# Patient Record
Sex: Female | Born: 1960 | Race: White | Hispanic: No | Smoking: Never smoker
Health system: Southern US, Community
[De-identification: ages and names within clinical notes are randomized; demographics above are authoritative.]

## PROBLEM LIST (undated history)

## (undated) DIAGNOSIS — I639 Cerebral infarction, unspecified: Secondary | ICD-10-CM

## (undated) DIAGNOSIS — K56609 Unspecified intestinal obstruction, unspecified as to partial versus complete obstruction: Secondary | ICD-10-CM

## (undated) DIAGNOSIS — F419 Anxiety disorder, unspecified: Secondary | ICD-10-CM

## (undated) DIAGNOSIS — F329 Major depressive disorder, single episode, unspecified: Secondary | ICD-10-CM

## (undated) DIAGNOSIS — K219 Gastro-esophageal reflux disease without esophagitis: Secondary | ICD-10-CM

## (undated) DIAGNOSIS — I82409 Acute embolism and thrombosis of unspecified deep veins of unspecified lower extremity: Secondary | ICD-10-CM

## (undated) DIAGNOSIS — F32A Depression, unspecified: Secondary | ICD-10-CM

## (undated) HISTORY — DX: Unspecified intestinal obstruction, unspecified as to partial versus complete obstruction: K56.609

## (undated) HISTORY — DX: Depression, unspecified: F32.A

## (undated) HISTORY — DX: Cerebral infarction, unspecified: I63.9

## (undated) HISTORY — DX: Acute embolism and thrombosis of unspecified deep veins of unspecified lower extremity: I82.409

## (undated) HISTORY — DX: Anxiety disorder, unspecified: F41.9

## (undated) HISTORY — DX: Gastro-esophageal reflux disease without esophagitis: K21.9

## (undated) HISTORY — DX: Major depressive disorder, single episode, unspecified: F32.9

---

## 2004-08-24 ENCOUNTER — Other Ambulatory Visit: Payer: Self-pay

## 2004-08-24 ENCOUNTER — Emergency Department: Payer: Self-pay | Admitting: Emergency Medicine

## 2005-09-14 ENCOUNTER — Other Ambulatory Visit: Payer: Self-pay

## 2005-09-14 ENCOUNTER — Emergency Department: Payer: Self-pay | Admitting: General Practice

## 2006-07-09 ENCOUNTER — Emergency Department: Payer: Self-pay | Admitting: Emergency Medicine

## 2006-07-22 ENCOUNTER — Emergency Department: Payer: Self-pay | Admitting: Internal Medicine

## 2006-07-22 ENCOUNTER — Other Ambulatory Visit: Payer: Self-pay

## 2007-12-27 ENCOUNTER — Emergency Department: Payer: Self-pay | Admitting: Emergency Medicine

## 2008-04-16 ENCOUNTER — Emergency Department: Payer: Self-pay | Admitting: Emergency Medicine

## 2008-12-28 ENCOUNTER — Emergency Department: Payer: Self-pay | Admitting: Emergency Medicine

## 2009-03-23 ENCOUNTER — Emergency Department: Payer: Self-pay | Admitting: Unknown Physician Specialty

## 2009-04-10 ENCOUNTER — Emergency Department: Payer: Self-pay | Admitting: Emergency Medicine

## 2010-04-01 ENCOUNTER — Inpatient Hospital Stay: Payer: Self-pay | Admitting: Surgery

## 2010-08-03 ENCOUNTER — Emergency Department: Payer: Self-pay | Admitting: Emergency Medicine

## 2011-01-30 IMAGING — CR DG CHEST 2V
1 series · 2 of 2 positions shown · non-contrast
Comparison: none

REASON FOR EXAM: palpatations
COMMENTS:   LMP: Now

PROCEDURE:     DXR - DXR CHEST PA (OR AP) AND LATERAL  - April 16, 2008  [DATE]
RESULT:     The lung fields are clear. The heart, mediastinal and osseous
structures show no acute changes. Degenerative spurring is noted at multiple
levels of the thoracic spine.

[Series 1: view not recorded · 0.17mm/px · 2 of 2 slices shown]
[im 1/2]
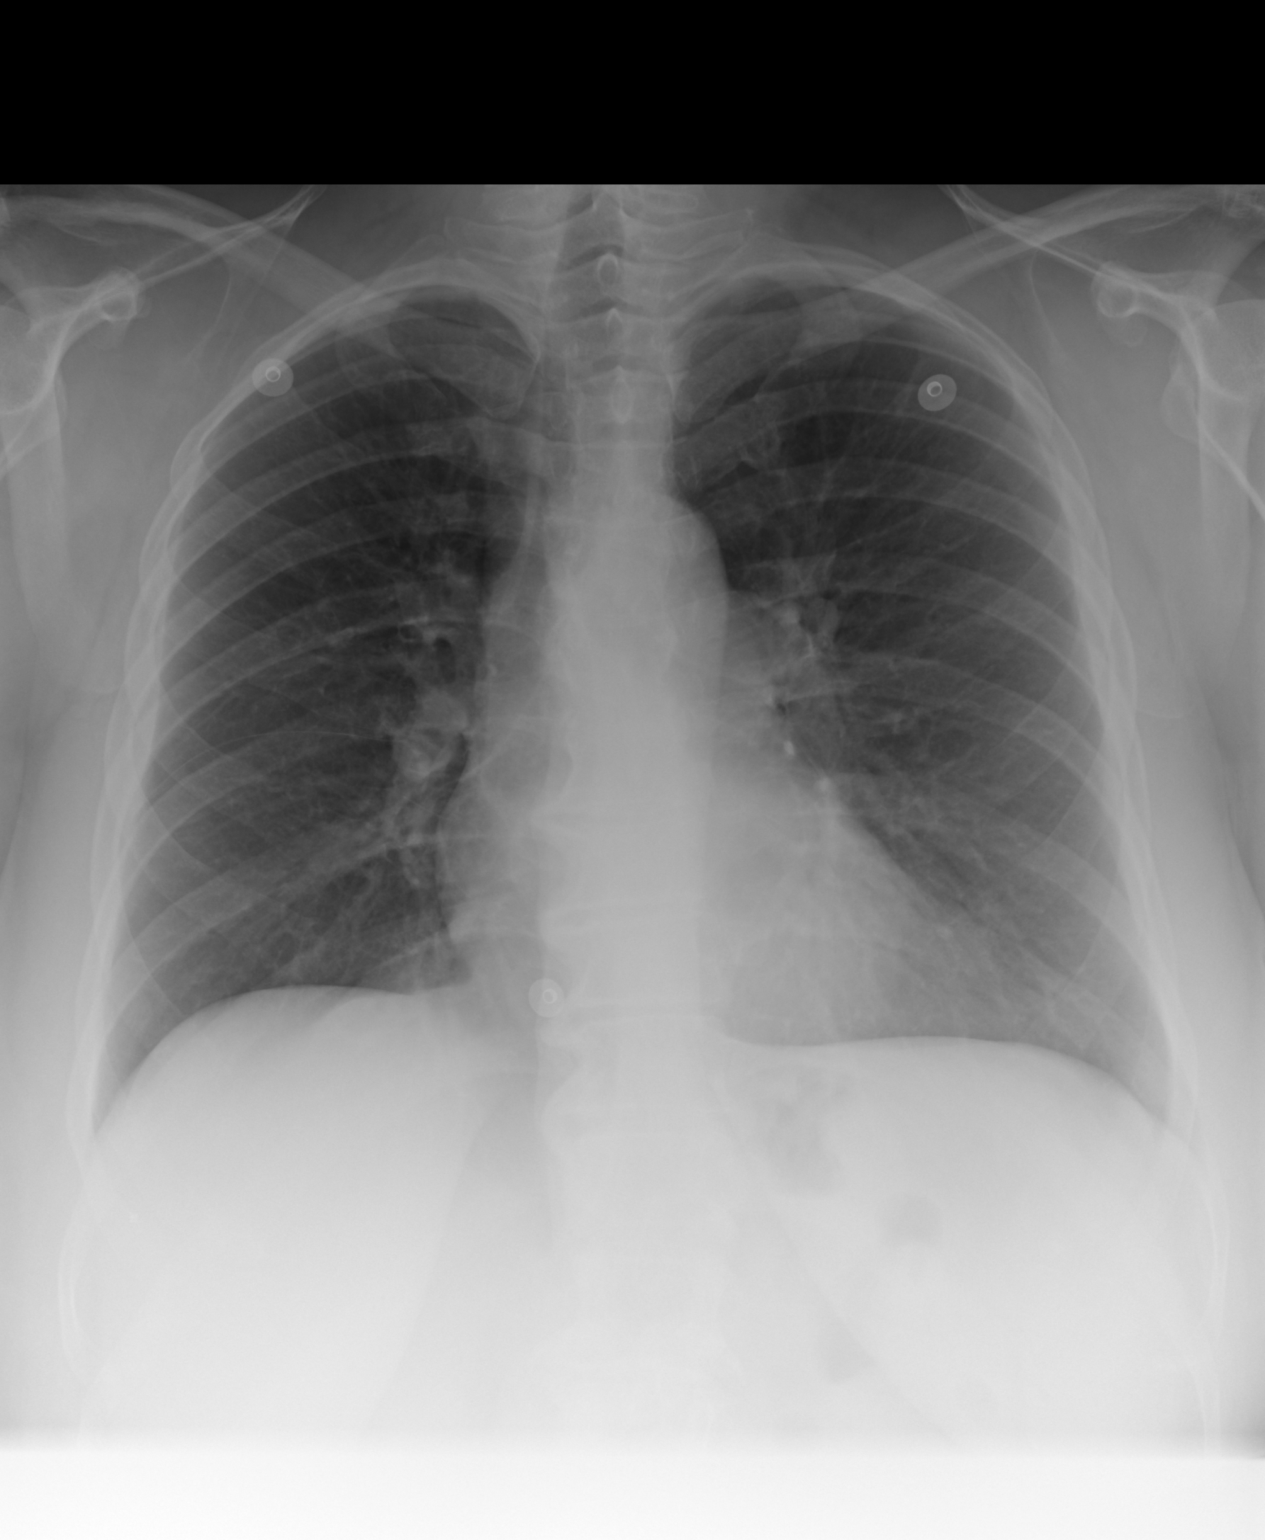
[im 2/2]
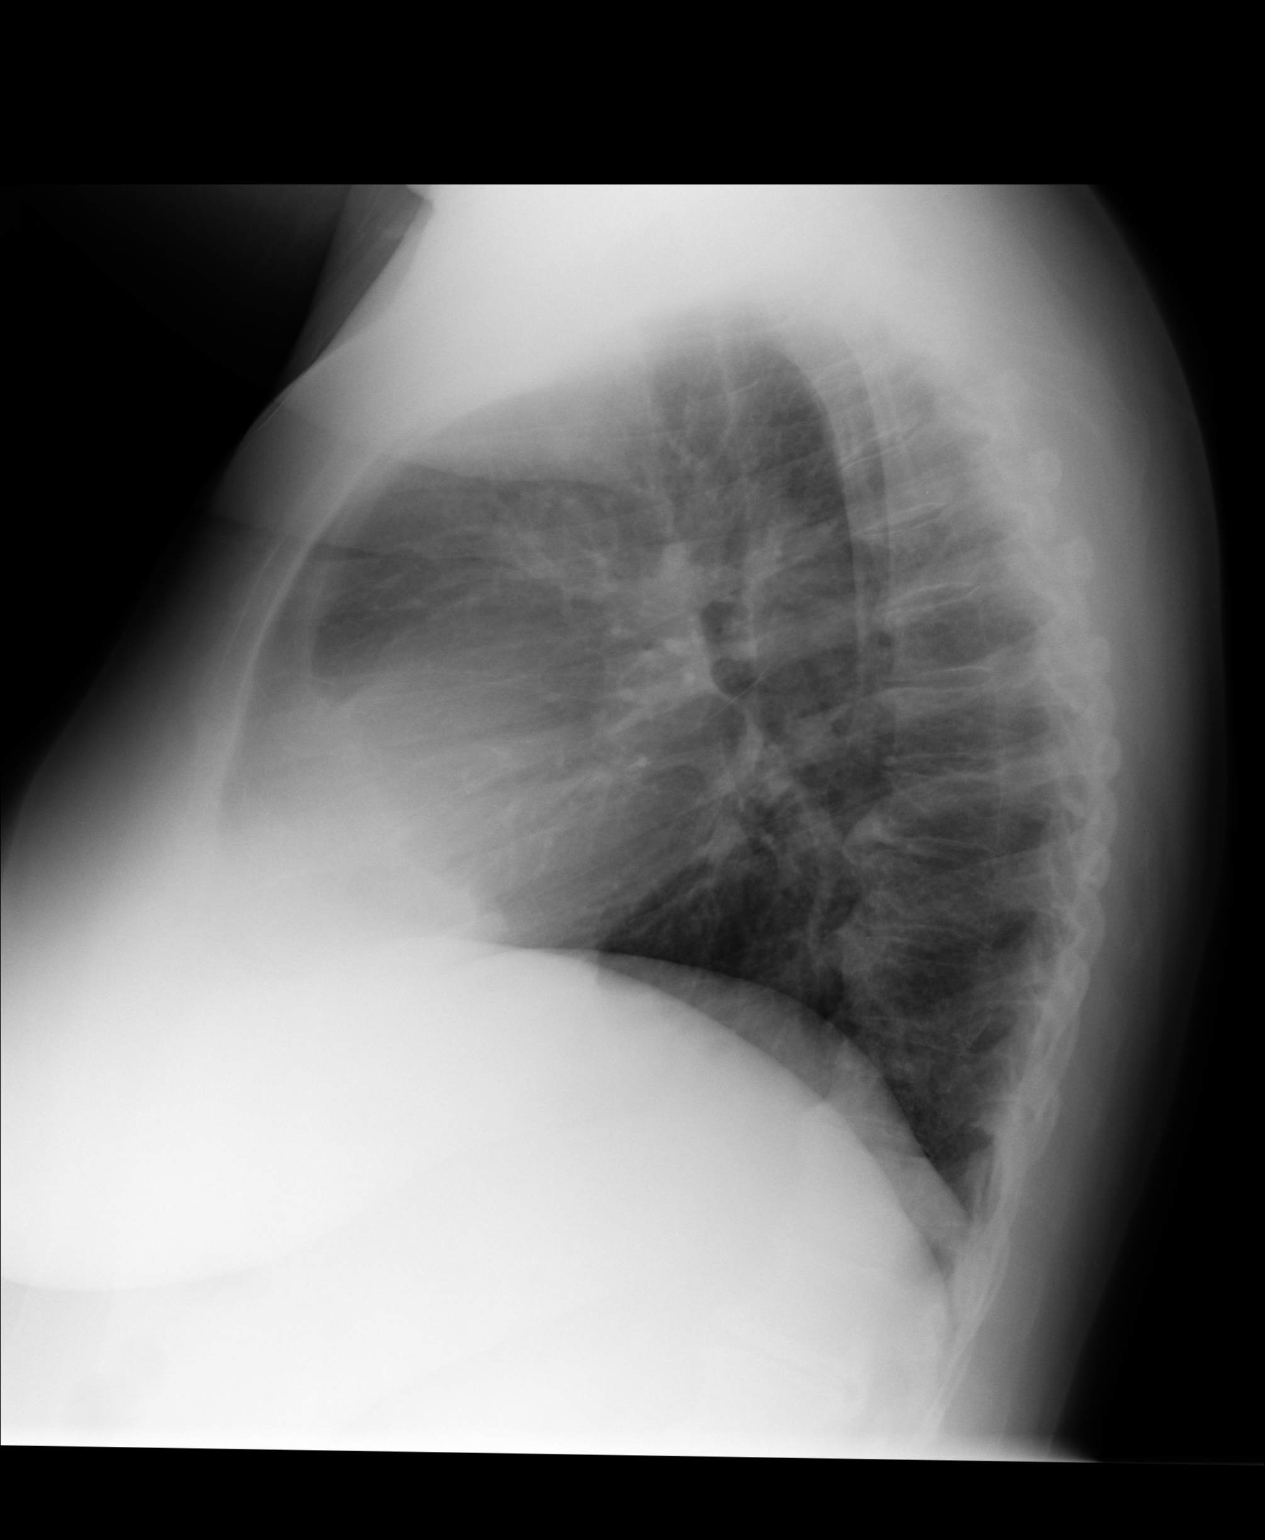

[2 of 2 positions shown; findings below may reference images not displayed]

IMPRESSION: No acute changes are identified.

## 2011-07-27 ENCOUNTER — Inpatient Hospital Stay: Payer: Self-pay | Admitting: Surgery

## 2011-07-27 LAB — URINALYSIS, COMPLETE
Nitrite: NEGATIVE
RBC,UR: 2 /HPF (ref 0–5)
Specific Gravity: 1.023 (ref 1.003–1.030)
Squamous Epithelial: 2
WBC UR: 3 /HPF (ref 0–5)

## 2011-07-27 LAB — COMPREHENSIVE METABOLIC PANEL
Albumin: 4 g/dL (ref 3.4–5.0)
Anion Gap: 6 — ABNORMAL LOW (ref 7–16)
Bilirubin,Total: 0.3 mg/dL (ref 0.2–1.0)
Chloride: 105 mmol/L (ref 98–107)
Co2: 28 mmol/L (ref 21–32)
EGFR (African American): >60
Glucose: 111 mg/dL — ABNORMAL HIGH (ref 65–99)
Potassium: 3.8 mmol/L (ref 3.5–5.1)
SGOT(AST): 33 U/L (ref 15–37)
Sodium: 139 mmol/L (ref 136–145)

## 2011-07-27 LAB — CBC
HCT: 27.3 % — ABNORMAL LOW (ref 35.0–47.0)
MCH: 16.1 pg — ABNORMAL LOW (ref 26.0–34.0)
Platelet: 757 10*3/uL — ABNORMAL HIGH (ref 150–440)
RBC: 5.04 10*6/uL (ref 3.80–5.20)
RDW: 22.3 % — ABNORMAL HIGH (ref 11.5–14.5)
WBC: 15.8 10*3/uL — ABNORMAL HIGH (ref 3.6–11.0)

## 2011-07-27 LAB — APTT: Activated PTT: 33.7 secs (ref 23.6–35.9)

## 2011-07-27 LAB — RETICULOCYTES
Absolute Retic Count: 0.0849 10*6/uL — ABNORMAL HIGH (ref 0.024–0.084)
Reticulocyte: 1.72 % — ABNORMAL HIGH (ref 0.5–1.5)

## 2011-07-27 LAB — LIPASE, BLOOD: Lipase: 119 U/L (ref 73–393)

## 2011-07-27 LAB — LACTATE DEHYDROGENASE: LDH: 197 U/L (ref 84–246)

## 2011-07-28 LAB — BASIC METABOLIC PANEL
BUN: 16 mg/dL (ref 7–18)
Calcium, Total: 8.9 mg/dL (ref 8.5–10.1)
Chloride: 106 mmol/L (ref 98–107)
Co2: 29 mmol/L (ref 21–32)
EGFR (African American): >60
EGFR (Non-African Amer.): >60
Glucose: 117 mg/dL — ABNORMAL HIGH (ref 65–99)
Osmolality: 282 (ref 275–301)
Potassium: 3.9 mmol/L (ref 3.5–5.1)

## 2011-07-28 LAB — CBC WITH DIFFERENTIAL/PLATELET
Basophil #: 0 10*3/uL (ref 0.0–0.1)
Basophil %: 0.4 %
Eosinophil %: 0.8 %
Lymphocyte #: 1.7 10*3/uL (ref 1.0–3.6)
Lymphocyte %: 16.9 %
MCH: 15.8 pg — ABNORMAL LOW (ref 26.0–34.0)
MCV: 54 fL — ABNORMAL LOW (ref 80–100)
Monocyte #: 1 x10 3/mm — ABNORMAL HIGH (ref 0.2–0.9)
RDW: 21.6 % — ABNORMAL HIGH (ref 11.5–14.5)

## 2011-07-28 LAB — IRON AND TIBC
Iron Bind.Cap.(Total): 523 ug/dL — ABNORMAL HIGH (ref 250–450)
Iron Saturation: 3 %

## 2013-02-11 HISTORY — PX: CARDIAC CATHETERIZATION: SHX172

## 2013-03-06 LAB — BASIC METABOLIC PANEL
BUN: 20 mg/dL — ABNORMAL HIGH (ref 7–18)
Calcium, Total: 9.7 mg/dL (ref 8.5–10.1)
Chloride: 107 mmol/L (ref 98–107)
Creatinine: 0.87 mg/dL (ref 0.60–1.30)
Sodium: 141 mmol/L (ref 136–145)

## 2013-03-06 LAB — CBC
HCT: 42.8 % (ref 35.0–47.0)
MCH: 28.7 pg (ref 26.0–34.0)
MCHC: 34 g/dL (ref 32.0–36.0)
MCV: 84 fL (ref 80–100)
Platelet: 407 10*3/uL (ref 150–440)
RBC: 5.07 10*6/uL (ref 3.80–5.20)
RDW: 14.5 % (ref 11.5–14.5)

## 2013-03-06 LAB — TROPONIN I: Troponin-I: 0.07 ng/mL — ABNORMAL HIGH

## 2013-03-07 ENCOUNTER — Inpatient Hospital Stay: Payer: Self-pay | Admitting: Internal Medicine

## 2013-03-07 DIAGNOSIS — I214 Non-ST elevation (NSTEMI) myocardial infarction: Secondary | ICD-10-CM

## 2013-03-07 DIAGNOSIS — Z86711 Personal history of pulmonary embolism: Secondary | ICD-10-CM

## 2013-03-07 DIAGNOSIS — Z8673 Personal history of transient ischemic attack (TIA), and cerebral infarction without residual deficits: Secondary | ICD-10-CM

## 2013-03-07 LAB — CK TOTAL AND CKMB (NOT AT ARMC)
CK, Total: 577 U/L — ABNORMAL HIGH (ref 21–215)
CK, Total: 503 U/L — ABNORMAL HIGH (ref 21–215)
CK-MB: 6.2 ng/mL — ABNORMAL HIGH (ref 0.5–3.6)

## 2013-03-07 LAB — PROTIME-INR
INR: 1.5
Prothrombin Time: 18.2 secs — ABNORMAL HIGH (ref 11.5–14.7)

## 2013-03-07 LAB — TROPONIN I
Troponin-I: 0.08 ng/mL — ABNORMAL HIGH
Troponin-I: 0.07 ng/mL — ABNORMAL HIGH

## 2013-03-08 ENCOUNTER — Encounter: Payer: Self-pay | Admitting: *Deleted

## 2013-03-08 DIAGNOSIS — I200 Unstable angina: Secondary | ICD-10-CM

## 2013-03-08 DIAGNOSIS — I517 Cardiomegaly: Secondary | ICD-10-CM

## 2013-03-08 LAB — BASIC METABOLIC PANEL
Calcium, Total: 9 mg/dL (ref 8.5–10.1)
Creatinine: 0.75 mg/dL (ref 0.60–1.30)
Glucose: 103 mg/dL — ABNORMAL HIGH (ref 65–99)
Osmolality: 276 (ref 275–301)
Potassium: 3.9 mmol/L (ref 3.5–5.1)

## 2013-03-08 LAB — LIPID PANEL
Cholesterol: 148 mg/dL (ref 0–200)
Triglycerides: 183 mg/dL (ref 0–200)
VLDL Cholesterol, Calc: 37 mg/dL (ref 5–40)

## 2013-03-11 ENCOUNTER — Telehealth: Payer: Self-pay

## 2013-03-11 NOTE — Telephone Encounter (Signed)
Attempted to contact pt regarding discharge from [facility] on 03/08/13. Patient has follow up with provider Dr. Kirke Corin on 03/28/13 at 2:15 at [facility].   Young man answered the phone stating the does not know anyone by the name of Shalie.

## 2013-03-20 ENCOUNTER — Encounter: Payer: Self-pay | Admitting: *Deleted

## 2013-03-28 ENCOUNTER — Encounter: Payer: Self-pay | Admitting: Cardiovascular Disease

## 2013-03-28 ENCOUNTER — Encounter: Payer: Self-pay | Admitting: *Deleted

## 2014-05-18 ENCOUNTER — Emergency Department: Payer: Self-pay | Admitting: Student

## 2014-07-04 NOTE — Discharge Summary (Signed)
PATIENT NAME:  Jillian Vazquez, Jillian Vazquez MR#:  [id number] DATE OF BIRTH:  1960/04/24  DATE OF ADMISSION:  03/07/2013  DATE OF DISCHARGE:  03/08/2013  DISCHARGE DIAGNOSIS: Non-ST elevation myocardial infarction, status post cardiac catheterization showing normal coronaries. Minimal troponin elevation could be secondary to coronary spasm or supply demand ischemia. Exact etiology is not clear at this time.   SECONDARY DIAGNOSES: 1.  Anxiety and depression.  2.  GERD.   3.  CVA. 4.  History of small bowel obstruction.  5.  History of DVT and PE, on chronic Coumadin.   CONSULTATION: Cardiology, Dr. Lorine BearsMuhammad Arida.   PROCEDURES/RADIOLOGY: Cardiac cath on December 26 by Dr. Kirke CorinArida showed normal coronary arteries, EF of 60%.   A 2-D echocardiogram on December 26 showed LVEF of 55% to 60%, impaired relaxation pattern of LV diastolic filling, mild concentric LVH, mildly dilated left atrium.   Chest x-ray on December 24 showed no acute cardiopulmonary disease.   HISTORY AND SHORT HOSPITAL COURSE: The patient is a 54 year old female with the above-mentioned medical problems, who was admitted for non-ST elevation MI with symptoms of chest, back and abdominal pain. Please see Dr. Camillia HerterMody's dictated History and Physical for further details. Cardiology consultation was obtained by Dr. Lorine BearsMuhammad Arida, who recommended a 2-D echocardiogram, which was obtained, with results dictated above. He also recommended cardiac cath, considering patient's elevated troponin, which was performed on December 26, showing normal coronary arteries. The patient remained chest pain-free and most of her symptoms were resolved, and was discharged home on December 26 in stable condition.   On the date of discharge, her vital signs were as follows: Temperature 98.3, heart rate 86 per minute, respirations 18 per minute, blood pressure 114/78 mmHg. She was  saturating 95% on room air.   PERTINENT PHYSICAL EXAMINATION ON THE DATE OF DISCHARGE:   CARDIOVASCULAR: S1, S2 normal. No murmur, rub, gallop.  LUNGS: Clear to auscultation bilaterally. No wheezing, rales, rhonchi, or crepitation.  ABDOMEN: Soft, benign.  NEUROLOGIC: Nonfocal examination.  All other physical examination remained at baseline.   DISCHARGE MEDICATIONS: 1.  Omeprazole 20 mg p.o. daily. 2.  Warfarin  5 mg p.o. daily.  3.  Lovastatin 40 mg p.o. daily.  4.  Ferrex 150 mg p.o. b.i.d.  5.  Vitamin B12, 1000 mcg p.o. daily.  6.  Celexa 20 mg p.o. daily.  7.  Klonopin 0.5 mg p.o. 3 times a day as needed.  8.  Metoprolol 25 mg p.o. b.i.d.  9.  Aspirin 81 mg p.o. daily.   DISCHARGE DIET: Low sodium, low fat, low cholesterol.   DISCHARGE ACTIVITY: As tolerated.   DISCHARGE INSTRUCTIONS AND FOLLOWUP: The patient was instructed to follow up with her primary care physician, [facility]. [facility]mer [facility]ard [facility], in 1 to 2 weeks. She will need followup with Dr. Kirke CorinArida in 2 to 4 weeks.   Total time discharging this patient was 55 minutes.     ____________________________ Ellamae SiaVipul S. Sherryll BurgerShah, MD vss:mr D: 03/10/2013 10:43:00 ET T: 03/10/2013 20:21:50 ET JOB#: [id number]  cc: Lorrene Graef S. Sherryll BurgerShah, MD, <Dictator> Muhammad A. Kirke CorinArida, MD DR. Wilmer FloorICHARD MEREDITH, [facility]  Ellamae SiaVIPUL S [facility] DtHAH MD ELECTRONICALLY SIGNED 03/11/2013 6:51

## 2014-07-04 NOTE — Consult Note (Signed)
Brief Consult Note: Diagnosis: NSTEMI.   Patient was seen by consultant.   Consult note dictated.   Comments: check echo today.  Added Metoprolol.  Cardiac cath tomorrow.  Electronic Signatures: Lorine BearsArida, Muhammad (MD)  (Signed 25-Dec-14 10:00)  Authored: Brief Consult Note   Last Updated: 25-Dec-14 10:00 by Lorine BearsArida, Muhammad (MD)

## 2014-07-04 NOTE — H&P (Signed)
PATIENT NAME:  Jillian Vazquez, Jillian Vazquez MR#:  [id number] DATE OF BIRTH:  [birth year]  DATE OF ADMISSION:  03/07/2013  PRIMARY CARE PHYSICIAN: Wilmer Floor at [facility].   CHIEF COMPLAINT: Chest pain, back pain and abdominal pain.   HISTORY OF PRESENT ILLNESS: This is a 54 year old female with a history of depression, anxiety, GERD, CVA, PE on chronic Coumadin who presents with the above complaint. The patient says that today around 10 p.m. she developed chest pain, back pain and abdominal pain after she went to the bathroom. She was straining really hard and all of a sudden she had this extreme 10 out of 10 chest pain, back pain and abdominal pain. It was so bad that she came to the ER for further evaluation. When she got to the ER, it had already eased off. Associated with the chest pain she said she had palpitations and nausea. No vomiting, diaphoresis. No radiation to her jaw or arms. Her pain has subsided now. She has never had these symptoms before. In the ER, her troponin was 0.07.   REVIEW OF SYSTEMS:   CONSTITUTIONAL: No fever, fatigue, weakness, weight loss or gain.  EYES: No blurred or double vision.  ENT: No ear pain, tinnitus. Positive snoring. No epistaxis, dentures or redness of the oropharynx.  RESPIRATORY: No cough, wheezing, hemoptysis, dyspnea.  CARDIOVASCULAR: Positive chest pain. No orthopnea, edema, arrhythmia, dyspnea on exertion. Positive palpitations. No syncope.   GASTROINTESTINAL: Positive nausea. No vomiting, diarrhea. Positive abdominal pain. No melena or ulcers.  GENITOURINARY: No dysuria or hematuria.  ENDOCRINE: No polyuria or polydipsia.  HEMATOLOGIC AND LYMPHATIC: No anemia or easy bruising.  SKIN: No rash or lesions.   MUSCULOSKELETAL: No gout or swelling.  NEUROLOGIC: Positive history of CVA. No weakness or dysarthria.  PSYCHIATRIC: Positive anxiety and depression.   PAST MEDICAL HISTORY:  1. Anxiety and depression.  2. GERD.  3. CVA.   4. History of small  bowel obstruction.  5. History of DVT and PE, on chronic Coumadin.   ALLERGIES: VICODIN AND PERCOCET CAUSED ITCHINESS.   SOCIAL HISTORY: No tobacco. Occasional alcohol. No IV drug use.   SURGICAL HISTORY:  1. SBO. 2. Tonsillectomy.  3. Appendectomy.   FAMILY HISTORY: Unknown.   MEDICATIONS:  1. Coumadin 5 mg daily.  2. Vitamin B12 1000 mcg daily.  3. Omeprazole 20 mg daily.  4. Lovastatin 40 mg daily.  5. Klonopin 0.5 mg t.i.d. p.r.n.  6. Ferrex 150 b.i.d.  7  Leapro 20 mg daily.   PHYSICAL EXAMINATION:  VITAL SIGNS: Temperature 98.3, pulse 82, respirations 18, blood pressure 152/68 and 97% on room air.  GENERAL: The patient is alert, oriented, not in acute distress.  HEENT: Head is atraumatic. Pupils are round. Sclerae anicteric. Mucous membranes are moist. Oropharynx is clear.   NECK: Supple. No JVD, carotid bruit or enlarged thyroid.  CARDIOVASCULAR: Regular rate and rhythm. There are no murmurs, gallops or rubs. PMI is not displaced.  LUNGS: Clear to auscultation without crackles, rales, rhonchi or wheezing. Normal percussion. Normal chest expansion.  ABDOMEN: Obese. Bowel sounds are positive. Nontender, nondistended. No hepatosplenomegaly. No rebound or guarding.  EXTREMITIES: No clubbing, cyanosis or edema.  NEUROLOGIC: Cranial nerves II through XII are intact. No focal deficits.  SKIN: Intact without any rash or lesions.  MUSCULOSKELETAL: Strength is 5 out of 5 in all extremities with no pathology to nails or digits.    LABORATORIES: Troponin 0.07. White blood cells 11.8, hemoglobin 14.6, hematocrit 43, platelets are 407. Sodium  141, potassium 3.6, chloride 107, bicarb 31, BUN 20, creatinine 0.87, glucose 111. INR is 1.5. Chest x-ray shows no acute cardiopulmonary disease. EKG: Normal sinus rhythm. There is no ST elevation or depression.   ASSESSMENT AND PLAN: This is a 54 year old female who presents with chest pain, back pain after having a bowel movement, with elevated  troponin.  1. Non-ST elevation myocardial infarction: The patient will be admitted to telemetry. Have started Lovenox. Her INR is 1.5. She is on Coumadin; however, if her troponins continue to climb, she may need a cardiac catheterization. Therefore, I will hold Coumadin and place her on full-dose Lovenox. The patient will also be started on low-dose aspirin. Continue her statin medication. Cardiology consultation. Further management as per cardiology.  2. Depression and anxiety: Will continue the patient on her outpatient medications.  3. History of cerebrovascular accident: The patient is on Coumadin and not actually on aspirin. We are holding Coumadin as mentioned above.  4. Elevated blood pressure: The patient does not have a history of hypertension. I have written for p.r.n., hydralazine. Will continue to monitor. The patient may need some medications or close followup at discharge.   The patient is FULL CODE status.    TIME SPENT: Approximately 40 minutes.   ____________________________ Janyth ContesSital P. Juliene PinaMody, MD spm:gb D: 03/07/2013 01:49:34 ET T: 03/07/2013 02:50:45 ET JOB#: [id number]  cc: Alanis Clift P. Juliene PinaMody, MD, <Dictator> Wilmer Floorichard Meredith, M.D., [facility] Tanish Prien P Naydeline Morace MD ELECTRONICALLY SIGNED 03/08/2013 0:04

## 2014-07-05 NOTE — Consult Note (Signed)
PATIENT NAME:  Jillian Vazquez, Jillian Vazquez MR#:  [id number] DATE OF BIRTH:  30-Jun-1960  DATE OF CONSULTATION:  03/07/2013  REFERRING PHYSICIAN:  Dr. Juliene Pina. CONSULTING PHYSICIAN:  Muhammad A. Kirke Corin, MD  PRIMARY CARE PHYSICIAN:  Wilmer Floor, [facility] at [facility].   REASON FOR CONSULTATION:  Myocardial infarction.   HISTORY OF PRESENT ILLNESS:  This is a 54 year old Caucasian female with a previous history of a stroke three years ago, pulmonary embolism in 2011 on chronic lifelong anticoagulation with warfarin, anxiety, depression, and gastroesophageal reflux disease. She presented to the Emergency Room with chest pain. This happened last night around 10:00 p.m. when she was in the bathroom. She was straining hard and all of a sudden she had a substernal aching sensation, which was severe in intensity, radiated to the abdominal and back area. It was associated with palpitations. This continued for about one hour and then resolved spontaneously. She was found to have mildly elevated cardiac enzymes. She is currently chest pain-free. She denies any previous similar symptoms.   PAST MEDICAL HISTORY:  1.  CVA.   2.  A history of DVT and pulmonary embolism on chronic anticoagulation with warfarin. She reports that she had recurrent episodes.  3.  Gastroesophageal reflux disease.  4.  A history of small bowel obstruction.  5.  Anxiety and depression.   ALLERGIES:  VICODIN AND PERCOCET.   SOCIAL HISTORY:  Negative for tobacco use. She does drink alcohol occasionally excessively but not on a regular basis. She denies any recreational drug use.   FAMILY HISTORY:  Negative for premature coronary artery disease.   HOME MEDICATIONS:  1.  Warfarin.  2.  Vitamin B12.  3.  Omeprazole.  4.  Lovastatin. 5.  Klonopin. 6.  Ferrous sulfate.   REVIEW OF SYSTEMS:  A 10-point review of systems was performed. It is negative other than what is mentioned in the HPI.   PHYSICAL EXAMINATION:  GENERAL:  The patient  appears to be at her stated age and in no acute distress.  VITAL SIGNS:  Temperature is 98.2, pulse is 85, respiratory rate is 18, blood pressure is 138/78, and oxygen saturation is 96% on room air. The blood pressure was slightly higher on presentation.  HEENT:  Normocephalic, atraumatic.  NECK:  No JVD or carotid bruits.  RESPIRATORY:  Normal respiratory effort with no use of accessory muscles. Auscultation reveals normal breath sounds.  CARDIOVASCULAR:  Normal PMI. Normal S1 and S2 with no gallops or murmurs.  ABDOMEN:  Benign, nontender, and nondistended.  EXTREMITIES:  No clubbing, cyanosis, or edema.  SKIN:  Warm and dry with no rash.  PSYCHIATRIC:  Alert, oriented x 3 with normal mood and affect.   LABORATORY, DIAGNOSTIC, AND RADIOLOGICAL DATA:  ECG showed normal sinus rhythm with nonspecific T-wave changes. Renal function was normal. Troponin was 0.07 with a CK-MB of 6.2. White cell count was 11.8. INR was 1.5.   IMPRESSION:  1.  Non-ST elevation myocardial infarction.  2.  A previous history of stroke.  3.  A history of deep vein thrombosis and pulmonary embolism on long-term anticoagulation.   RECOMMENDATIONS:  I recommend continuing current management with aspirin, full dose molecular weight heparin and a statin. I added metoprolol 25 mg twice daily. I will obtain an echocardiogram today. I discussed management options with the patient and recommend proceeding with cardiac catheterization and possible coronary intervention. The risks, benefits and alternatives were discussed with the patient. The cardiac   catheterization will be scheduled for  tomorrow. If PCI is needed, I will likely use a bare metal stent to avoid the need for prolonged dual antiplatelet therapy.  ____________________________ Chelsea AusMuhammad A. Kirke CorinArida, MD maa:jm D: 03/07/2013 10:06:42 ET T: 03/07/2013 10:48:19 ET JOB#: [id number]  cc: Muhammad A. Kirke CorinArida, MD, <Dictator> Sital P. Juliene PinaMody, MD Wilmer Floorichard Meredith, MD [facility] Argentina DonovanA  ARIDA MD ELECTRONICALLY SIGNED 04/01/2013 9:59

## 2014-07-06 NOTE — Discharge Summary (Signed)
PATIENT NAME:  Jillian Vazquez, Jillian Vazquez MR#:  [id number] DATE OF BIRTH:  01-Jan-1961  DATE OF ADMISSION:  07/27/2011 DATE OF DISCHARGE:  07/29/2011   PRINCIPLE DIAGNOSIS: Recurrent partial small bowel obstruction.   OTHER DIAGNOSES:  1. Iron deficiency anemia. 2. Obesity. 3. ? history of deep vein thrombosis. 4. Depression.   HOSPITAL COURSE: [name] was admitted to the hospital and underwent nasogastric suction for the first 24 hours and that was then discontinued and her diet was advanced to clear liquids. She had one bowel movement the day prior to discharge and two bowel movements the morning of discharge and her abdominal exam was benign and her repeat abdominal x-rays were markedly improved. She felt well and desired discharge home. She did receive one unit of packed red blood cells on the day prior to discharge for severe iron deficiency anemia and she also received one dose of IV iron. Although she has limited resources and she is not sure she can afford to have it filled, I prescribed iron polysaccharide 150 mg b.i.d. She does not require any scheduled surgical follow-up.    ____________________________ Claude MangesWilliam F. Ranveer Wahlstrom, MD wfm:ap D: 07/29/2011 13:13:44 ET T: 07/29/2011 13:35:13 ET JOB#: [id number]  cc: Claude MangesWilliam F. Nazeer Romney, MD, <Dictator> Claude MangesWILLIAM F Arick Mareno MD ELECTRONICALLY SIGNED 07/29/2011 15:00

## 2014-07-06 NOTE — H&P (Signed)
PATIENT NAME:  Jillian Vazquez MR#:  [id number] DATE OF BIRTH:  August 25, 1960  DATE OF ADMISSION:  07/27/2011  PRIMARY CARE PHYSICIAN: [facility]   ADMITTING PHYSICIAN: Quentin Ore, III, MD   CHIEF COMPLAINT: Abdominal pain, nausea, vomiting, diarrhea.   BRIEF HISTORY: Jillian Vazquez is a 54 year old woman seen in our Emergency Room with marked abdominal pain, nausea, vomiting, and diarrhea. She had a similar episode in January of 2012 which appeared to be a partial small bowel obstruction on her work-up. The CT scan at that time demonstrated some dilated loops of small bowel with evidence of air-fluid levels and decompressed distal small bowel. She responded to decompressive therapy and did well and had no further problems. This time, she had a similar episode starting over the weekend which worsened in the last 24 hours with increasing abdominal pain. She is no longer having diarrhea. She began to vomit significantly yesterday but has not vomited in the last 12 hours. She really has not been able to keep anything on her stomach. She has felt like she is becoming dehydrated and presented to the Emergency Room for further evaluation. In the Emergency Room, she was noted to have normal electrolytes, white blood cell count of 15,800, and hemoglobin of 8.1. Platelet count was 757,000. Pro time was 19.3. CT scan was performed which demonstrated some dilated distended loops of small bowel with air-fluid levels and a partial transition zone in the mid abdomen at the site of a previous surgical anastomosis. The Surgical Service was consulted.   She has a history of small bowel obstruction dating back to 2011. She underwent lysis of adhesions procedure in [facility] and had an anorectomy  at that time. We do not have the operative note from that admission. She also had an appendectomy in the distant past. Her symptoms resolved following her surgical procedure but recurred over the last several months  and then have gotten significantly worse with this most recent admission.   She has history of high blood pressure, previous CVA. She has been on anticoagulation on Coumadin since her stroke. She did not have any surgery and does not know what work-up was performed. She was admitted after a surgical procedure in [facility] with what sounds like a deep venous thrombosis. She does not have a vena cava filter as far she knows and has been on Coumadin since that admission. Her past history is also significant for gastroesophageal reflux disease and marked depression. She has also had a previous tonsillectomy. Most of her primary care is performed in [facility].   SOCIAL HISTORY: She is not a cigarette smoker at the present time. She does not drink alcohol regularly.   FAMILY HISTORY: Noncontributory.   REVIEW OF SYSTEMS: A 10-point review of systems is remarkable for significant shortness of breath on exertion, but otherwise she has no cardiac, pulmonary or other abdominal symptoms.   PHYSICAL EXAMINATION:  GENERAL: She is an alert pleasant woman who appears a bit distraught at the current situation.   VITAL SIGNS: Vital signs are stable. Blood pressure is 158/84, heart rate is 82 and regular, oxygen saturation is 96%.   HEENT: No scleral icterus. No facial deformity with normal pupils.   NECK: Neck is supple without adenopathy. Trachea is midline, and I cannot palpate her thyroid gland.   LUNGS: Lungs are clear with no adventitious sounds. She has normal pulmonary excursion.   CARDIAC: No murmurs or gallops to my ear, and she  seems to be in normal sinus rhythm.   ABDOMEN: Her abdomen is generally soft, mildly distended, with minimal abdominal tenderness. She has a large midline hernia which on CT scan is fat filled. I cannot reduce it. She has active bowel sounds with no evidence of any hyperactivity or rushes.   EXTREMITIES: Lower extremity exam reveals full range of motion, good distal  pulses and no deformities. She has some minimal edema.   PSYCHIATRIC: Normal orientation, but she does appear to be a bit depressed.   ASSESSMENT/RECOMMENDATIONS: I have independently reviewed the CT scan. She does appear to have partial small bowel obstruction. We will go ahead and admit her to the hospital. We will place her on nasogastric decompression again, rehydrate her, hold her Coumadin, and get Internal Medicine consult to assist us with her anticoagulation and hypertension. Hopefully we can avoid surgical intervention. This plan has been discussed with the patient in detail and she is in agreement.    ____________________________ Carmie Endalph L. Ely III, MD rle:cbb D: 07/27/2011 16:34:15 ET T: 07/27/2011 16:58:07 ET JOB#: [id number]  cc: Quentin Orealph L. Ely III, MD, <Dictator> Quentin OreALPH L ELY MD ELECTRONICALLY SIGNED 07/28/2011 14:09

## 2014-07-06 NOTE — Consult Note (Signed)
Brief Consult Note: Diagnosis: SBO, HTN, h/o DVT and arterial clot on coumadin, H/o CVA with no residual neuro deficits.   Patient was seen by consultant.   Consult note dictated.   Orders entered.   Comments: 54y/o F with PMH of HTN, depression, smoking, arterial clot and clot on coumadin, h/o CVA, prior h/o SBO requiring adhesiolysis admitted to surgical service for SBO aain. Medical consult requested for anticoagulation mgmt while NPO  * H/o arterial clot and DVT 1536yrs ago- coumadin on hold now, INR 1.6 since no plans for surgery right now- will start lovenox bid and once able to take PO- restart coumadin monitor hb esp as she is anemic  * Acute on chronic anemia- hb last year 4511, but in 2011 it was low again at 8, today low at 8.1 low MCV- likely chronic iron def recheck in am, if stable- can initiate iron replacement- start with IV while NPO if drops- needs bl Tx and rethink anticoagulation  * HTN- not on meds, monitor  * SBO- mgmt per surgery, NG tube and KUB f/u in am, conservative mgmt for now  * H/o CVA- stable, no residual neuro deficits  * Leukocytosis- stress reaction likely, afebrile for now, if doesnt improve by am- cultures and ABX  * GERD- IV protonix.  Electronic Signatures: Enid BaasKalisetti, Holleigh Crihfield (MD)  (Signed 15-May-13 19:25)  Authored: Brief Consult Note   Last Updated: 15-May-13 19:25 by Enid BaasKalisetti, Richell Corker (MD)

## 2014-07-06 NOTE — Consult Note (Signed)
PATIENT NAME:  Jillian Vazquez, Jillian Vazquez MR#:  [id number] DATE OF BIRTH:  Jul 31, 1960  DATE OF CONSULTATION:  07/27/2011  REFERRING PHYSICIAN:   CONSULTING PHYSICIAN:  Enid Baas, MD  ADMITTING PHYSICIAN: Dr. Michela Pitcher   REASON FOR CONSULTATION: Medical management, especially anticoagulant management.   PRIMARY CARE MD: Lennie Hummer, Dr. 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 According to the patient, she was fine up until two days ago when she started to have some abdominal pain associated with nausea especially on movement. She had diarrhea all day yesterday that improved this morning. She was nauseous and vomited once at home this morning and once again in the ED so she came for evaluation. She has an x-ray showing positive small bowel obstruction especially at the site of previous surgical anastomosis. She is admitted to surgical service and possible conservative management. NG tube is placed. She cannot take her oral medications including Coumadin so medical consult was requested.   PAST MEDICAL HISTORY:  1. Hypertension, not on any medications.  2. Depression.  3. Tobacco use.  4. Gastroesophageal reflux disease.  5. 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 Was worked up at [facility].  6. History of CVA presenting with dysarthria. Currently no neurological deficits. 7. History of iron deficiency anemia.    PAST SURGICAL HISTORY:  1. Previous laparoscopic adhesiolysis.  2. Tonsillectomy.  3. Appendectomy.    ALLERGIES TO MEDICATIONS: Percocet and Vicodin causing pruritus and nausea.   HOME MEDICATIONS CURRENTLY: 1. Warfarin 5 mg p.o. daily.  2. Zoloft 40 mg p.o. at bedtime.  3. Prilosec 20 mg p.o. daily.  4. Pravastatin but has not been taking this for more than a month.   SOCIAL HISTORY: Lives at home by herself. Smokes about 3 to 4 cigarettes per day. No history of any alcohol or drug use.   FAMILY HISTORY: Does not know much about her father. Mom with hypertension and arthritis.   REVIEW OF SYSTEMS: CONSTITUTIONAL: No fever, fatigue, or weakness. EYES: No blurred vision, double vision, glaucoma, or cataracts. ENT: No tinnitus, ear pain, epistaxis, or discharge. RESPIRATORY: No cough, wheezing, hemoptysis, or COPD. CARDIOVASCULAR: No chest pain, orthopnea, or edema. Past history of intermittent palpitations. No lower extremity edema. GI: Positive for abdominal pain, nausea, vomiting, and diarrhea. No melena, hematochezia, or hematemesis. GU: No dysuria, hematuria, renal calculus, frequency, or incontinence. ENDOCRINE: No polyuria, nocturia, thyroid problems, heat or cold intolerance. HEMATOLOGY: Positive for anemia. No easy bruising or bleeding. SKIN: No acne, rash, or lesions. MUSCULOSKELETAL: No neck, back, shoulder pain, arthritis, or gout. NEUROLOGIC: No numbness, weakness. History of CVA present. No seizures. PSYCHOLOGICAL: No anxiety, insomnia, or depression.   PHYSICAL EXAMINATION:   VITAL SIGNS: Blood pressure 99.1 degrees Fahrenheit, pulse 104, respirations 16, blood pressure 141/83, pulse oximetry 96% on room air.   GENERAL: Well nourished female lying in bed not in any acute distress.   HEENT: Normocephalic, atraumatic. Pupils equal, round, reacting to light. Anicteric sclerae. Pale mucous membranes. Oropharynx clear without erythema, mass, or exudates. Oral nasogastric tube is present.   NECK: Supple. No  thyromegaly, JVD, or carotid bruits. No lymphadenopathy.   LUNGS: Clear to  auscultation bilaterally. No wheeze or crackles. No use of accessory muscles for breathing.   CARDIOVASCULAR: S1, S2 regular rate and rhythm. No murmurs, rubs, or gallops.   ABDOMEN: Soft, tenderness in the left upper and lower quadrants with no guarding or rigidity. Hypoactive bowel sounds. Periumbilical ventral hernia is present without any signs of obstruction or incarceration.  EXTREMITIES: No pedal edema, clubbing or cyanosis. 2+ dorsalis pedis pulses palpable bilaterally.   SKIN: No acne, rash, or lesions.   LYMPHATICS: No cervical lymphadenopathy.   NEUROLOGIC: Cranial nerves intact. No focal motor or sensory deficits.   PSYCHOLOGICAL: The patient is awake, alert, oriented x3.   LABORATORY, DIAGNOSTIC, AND RADIOLOGICAL DATA: WBC 15.8, hemoglobin 8.1, hematocrit 27.3, platelet count 757, MCV 54, sodium 139, potassium 3.8, chloride 105, bicarb 28, BUN 13, creatinine 0.76, glucose 111, calcium 10.0, ALT 32, AST 33, alkaline phosphatase 83, total bilirubin 0.3, albumin 4.0. Lipase 119. INR 1.6. PTT 33.7. Urinalysis with no evidence of any infection.   CT of the abdomen and pelvis with contrast showing findings of small bowel obstruction with transition point in the mid abdomen in the region of prior small bowel anastomosis. Small amount of free fluid present in the pelvis. Uterus is heterogeneous with several areas of low attenuation likely related to fibroids. Several fat-containing ventral abdominal hernias are present.    RECOMMENDATIONS: This is a 54 year old female with history of hypertension, depression, smoking, history of DVT on Coumadin with prior history of CVA, prior history of small bowel obstruction requiring adhesiolysis admitted to the surgical service for small bowel obstruction again. Medical consult is requested for management of anticoagulation while n.p.o.  1. History of arterial clot and DVT about two years ago managed at [facility]. Coumadin is currently on hold since the  patient is n.p.o. Her admission INR is 1.6. Last dose of Coumadin was yesterday, probably not absorbed because of nausea, vomiting, diarrhea, small bowel obstruction. Since there appears to be no plans for surgery now, will start Lovenox b.i.d. and once she's able to take p.o. can restart Coumadin and transition. Monitor hemoglobin especially while she is anemic and is on anticoagulation.  2. Acute on chronic anemia. Hemoglobin from last year appears to be 11 but in 2007 it seems like her hemoglobin was low at 8. This admission also hemoglobin is at 8. Known history of fibroids and menstrual bleeding with iron deficiency anemia and was on iron supplementation in the past. She is menopausal and no menstrual bleeding since March of this year but since MCV is low I am assuming she has chronic iron deficiency anemia. Follow-up iron labs. Will hold off on starting any transfusion at this point. Recheck hemoglobin in the morning. If stable, iron replacement therapy can be initiated probably in the form of IV iron while she is n.p.o. If hemoglobin drops, then transfusion is needed and need for anticoagulation has to be reconsidered.  3. Hypertension. The patient is not on any home meds. Continue to monitor.  4. Small bowel obstruction. Management per Surgery. Currently has NG tube and has a KUB follow-up in the morning. Conservative management for now.  5. Leukocytosis, appears stress reaction likely. She is afebrile for now but if does not improve in the morning or if she starts having fevers then cultures need to be drawn and needs to be started on antibiotics that will cover intraabdominal infections too.  6. History of CVA, currently stable without  any symptoms. No residual neurological deficits. Monitor for now.  7. Gastroesophageal reflux disease. Appropriately placed on IV Protonix.   CODE STATUS: FULL CODE.   Thank you for allowing me to participate in the care of Jillian Vazquez.   TOTAL TIME SPENT ON  CONSULTATION: 50 minutes.   ____________________________ Enid Baasadhika Keyetta Hollingworth, MD rk:drc D: 07/27/2011 19:42:56 ET T: 07/28/2011 09:34:06 ET JOB#: [id number] Donnita FallsADHIKA Axxel Gude MD ELECTRONICALLY SIGNED 07/28/2011 13:07

## 2014-10-08 ENCOUNTER — Emergency Department
Admission: EM | Admit: 2014-10-08 | Discharge: 2014-10-08 | Disposition: A | Discharge status: Home / Self Care | Payer: Self-pay | Attending: Emergency Medicine | Admitting: Emergency Medicine

## 2014-10-08 ENCOUNTER — Encounter: Payer: Self-pay | Admitting: *Deleted

## 2014-10-08 DIAGNOSIS — Z7901 Long term (current) use of anticoagulants: Secondary | ICD-10-CM | POA: Insufficient documentation

## 2014-10-08 DIAGNOSIS — Z79899 Other long term (current) drug therapy: Secondary | ICD-10-CM | POA: Insufficient documentation

## 2014-10-08 DIAGNOSIS — K029 Dental caries, unspecified: Secondary | ICD-10-CM

## 2014-10-08 DIAGNOSIS — K047 Periapical abscess without sinus: Secondary | ICD-10-CM

## 2014-10-08 DIAGNOSIS — Z7982 Long term (current) use of aspirin: Secondary | ICD-10-CM | POA: Insufficient documentation

## 2014-10-08 DIAGNOSIS — Z792 Long term (current) use of antibiotics: Secondary | ICD-10-CM | POA: Insufficient documentation

## 2014-10-08 MED ORDER — LIDOCAINE VISCOUS 2 % MT SOLN: OROMUCOSAL | Status: AC

## 2014-10-08 MED ORDER — AMOXICILLIN 500 MG PO CAPS: 500.0000 mg | ORAL_CAPSULE | Freq: Three times a day (TID) | ORAL | Status: DC

## 2014-10-08 NOTE — ED Notes (Signed)
Pt here with c/o dental pain x 5 days.

## 2014-10-08 NOTE — Discharge Instructions (Signed)
Dental Caries Dental caries is tooth decay. This decay can cause a hole in teeth (cavity) that can get bigger and deeper over time. HOME CARE  Brush and floss your teeth. Do this at least two times a day.  Use a fluoride toothpaste.  Use a mouth rinse if told by your dentist or doctor.  Eat less sugary and starchy foods. Drink less sugary drinks.  Avoid snacking often on sugary and starchy foods. Avoid sipping often on sugary drinks.  Keep regular checkups and cleanings with your dentist.  Use fluoride supplements if told by your dentist or doctor.  Allow fluoride to be applied to teeth if told by your dentist or doctor. Document Released: 12/08/2007 Document Revised: 07/15/2013 Document Reviewed: 03/02/2012 [facility] Patient Information 2015 [place], [place]. This information is not intended to replace advice given to you by your health care provider. Make sure you discuss any questions you have with your health care provider.    KEEP YOUR APPOINTMENT WITH THE DENTIST AMOXIL FOR INFECTION LIDOCAINE TO TOOTH EVERY 4 HOURS AS NEEDED FOR PAIN

## 2014-10-08 NOTE — ED Provider Notes (Signed)
[facility] Emergency Department Provider Note  ____________________________________________  Time seen:  1:38 PM  I have reviewed the triage vital signs and the nursing notes.   HISTORY  Chief Complaint Dental Pain   HPI Jillian Vazquez is a 54 y.o. female is here today with complaint of dental pain 5 days. She states that she has been unable to take any medication for this. And she does not have a dentist. Currently she states her pain is 9 out of 10.She denies any fever or chills, nausea or vomiting.   Past Medical History  Diagnosis Date  . Anxiety and depression   . GERD (gastroesophageal reflux disease)   . CVA (cerebral infarction)   . Small bowel obstruction     hx  . DVT (deep venous thrombosis)     W/ PE    There are no active problems to display for this patient.   Past Surgical History  Procedure Laterality Date  . Cardiac catheterization  12/14    [facility]    Current Outpatient Rx  Name  Route  Sig  Dispense  Refill  . amoxicillin (AMOXIL) 500 MG capsule   Oral   Take 1 capsule (500 mg total) by mouth 3 (three) times daily.   30 capsule   0   . aspirin 81 MG tablet   Oral   Take 81 mg by mouth daily.         . citalopram (CELEXA) 20 MG tablet   Oral   Take 20 mg by mouth daily.         . clonazePAM (KLONOPIN) 0.5 MG tablet   Oral   Take 0.5 mg by mouth 3 (three) times daily as needed for anxiety.         . iron polysaccharides (NIFEREX) 150 MG capsule   Oral   Take 150 mg by mouth 2 (two) times daily.         Marland Kitchen lidocaine (XYLOCAINE) 2 % solution      Apply to cotton ball and apply to dental pain q 4 hours   100 mL   0     30 ml   . lovastatin (MEVACOR) 40 MG tablet   Oral   Take 40 mg by mouth at bedtime.         . metoprolol tartrate (LOPRESSOR) 25 MG tablet   Oral   Take 25 mg by mouth 2 (two) times daily.         Marland Kitchen omeprazole (PRILOSEC) 20 MG capsule   Oral   Take 20 mg by mouth daily.         . vitamin B-12 (CYANOCOBALAMIN) 1000 MCG tablet   Oral   Take 1,000 mcg by mouth daily.         Marland Kitchen warfarin (COUMADIN) 5 MG tablet   Oral   Take 5 mg by mouth daily.           Allergies Percocet and Vicodin  No family history on file.  Social History History  Substance Use Topics  . Smoking status: Never Smoker   . Smokeless tobacco: Not on file  . Alcohol Use: Yes     Comment: ocassional    Review of Systems Constitutional: No fever/chills Eyes: No visual changes. ENT: No sore throat. Cardiovascular: Denies chest pain. Respiratory: Denies shortness of breath. Gastrointestinal: No abdominal pain.  No nausea, no vomiting. Genitourinary: Negative for dysuria. Musculoskeletal: Negative for back pain. Skin: Negative for rash. Neurological: Negative for  headaches, focal weakness or numbness.  10-point ROS otherwise negative.  ____________________________________________   PHYSICAL EXAM:  VITAL SIGNS: ED Triage Vitals  Enc Vitals Group     BP 10/08/14 1244 134/74 mmHg     Pulse Rate 10/08/14 1244 101     Resp --      Temp 10/08/14 1244 98.1 F (36.7 C)     Temp Source 10/08/14 1244 Oral     SpO2 10/08/14 1244 95 %     Weight 10/08/14 1244 240 lb (108.863 kg)     Height 10/08/14 1244 5' 5.5" (1.664 m)     Head Cir --      Peak Flow --      Pain Score 10/08/14 1244 9     Pain Loc --      Pain Edu? --      Excl. in GC? --     Constitutional: Alert and oriented. Well appearing and in no acute distress. Eyes: Conjunctivae are normal. PERRL. EOMI. Head: Atraumatic. Nose: No congestion/rhinnorhea. Mouth/Throat: Mucous membranes are moist.  Oropharynx non-erythematous. Right lower molar approximately #31 with large cavity present. Minimal, edema surrounding the tooth. And no obvious signs of abscess. Neck: No stridor.   Hematological/Lymphatic/Immunilogical: No cervical lymphadenopathy. Cardiovascular: Normal rate, regular rhythm. Grossly normal heart  sounds.  Good peripheral circulation. Respiratory: Normal respiratory effort.  No retractions. Lungs CTAB. Gastrointestinal: Soft and nontender. No distention. No abdominal bruits. No CVA tenderness. Musculoskeletal: No lower extremity tenderness nor edema.  No joint effusions. Neurologic:  Normal speech and language. No gross focal neurologic deficits are appreciated. No gait instability. Skin:  Skin is warm, dry and intact. No rash noted. Psychiatric: Mood and affect are normal. Speech and behavior are normal.  ____________________________________________   LABS (all labs ordered are listed, but only abnormal results are displayed)  Labs Reviewed - No data to display ____________________________________________  PROCEDURES  Procedure(s) performed: None  Critical Care performed: No  ____________________________________________   INITIAL IMPRESSION / ASSESSMENT AND PLAN / ED COURSE  Pertinent labs & imaging results that were available during my care of the patient were reviewed by me and considered in my medical decision making (see chart for details).  Patient is to follow-up with dental clinic. She was placed on Amoxil 500 mg 3 times a day. She is also given a prescription for lidocaine viscus 2% to apply to a cotton ball to help with her dental pain. She is allergic to pain medication and continues to take Coumadin. She may take Tylenol if needed for pain. ____________________________________________   FINAL CLINICAL IMPRESSION(S) / ED DIAGNOSES  Final diagnoses:  Infected dental caries      Tommi Rumps, PA-C 10/08/14 1632  Minna Antis, MD 10/09/14 1301

## 2014-12-11 ENCOUNTER — Emergency Department: Payer: Self-pay

## 2014-12-11 ENCOUNTER — Emergency Department
Admission: EM | Admit: 2014-12-11 | Discharge: 2014-12-11 | Disposition: A | Discharge status: Home / Self Care | Payer: Self-pay | Attending: Student | Admitting: Student

## 2014-12-11 ENCOUNTER — Encounter: Payer: Self-pay | Admitting: Emergency Medicine

## 2014-12-11 DIAGNOSIS — Z79899 Other long term (current) drug therapy: Secondary | ICD-10-CM | POA: Insufficient documentation

## 2014-12-11 DIAGNOSIS — Z7901 Long term (current) use of anticoagulants: Secondary | ICD-10-CM | POA: Insufficient documentation

## 2014-12-11 DIAGNOSIS — Z7982 Long term (current) use of aspirin: Secondary | ICD-10-CM | POA: Insufficient documentation

## 2014-12-11 DIAGNOSIS — Z792 Long term (current) use of antibiotics: Secondary | ICD-10-CM | POA: Insufficient documentation

## 2014-12-11 DIAGNOSIS — M25562 Pain in left knee: Secondary | ICD-10-CM | POA: Insufficient documentation

## 2014-12-11 MED ORDER — KETOROLAC TROMETHAMINE 60 MG/2ML IM SOLN
60.0000 mg | Freq: Once | INTRAMUSCULAR | Status: DC
  Filled 2014-12-11: qty 2

## 2014-12-11 MED ORDER — ACETAMINOPHEN-CODEINE #3 300-30 MG PO TABS: 2.0000 | ORAL_TABLET | ORAL | Status: DC | PRN

## 2014-12-11 NOTE — ED Notes (Signed)
Pt here for left knee pain that has been ongoing for about 8 days.  Denies injury/trauma.  Has taken no OTC meds for the pain.  Pt endorses difficulty walking.

## 2014-12-11 NOTE — Discharge Instructions (Signed)
Knee Pain °The knee is the complex joint between your thigh and your lower leg. It is made up of bones, tendons, ligaments, and cartilage. The bones that make up the knee are: °· The femur in the thigh. °· The tibia and fibula in the lower leg. °· The patella or kneecap riding in the groove on the lower femur. °CAUSES  °Knee pain is a common complaint with many causes. A few of these causes are: °· Injury, such as: °· A ruptured ligament or tendon injury. °· Torn cartilage. °· Medical conditions, such as: °· Gout °· Arthritis °· Infections °· Overuse, over training, or overdoing a physical activity. °Knee pain can be minor or severe. Knee pain can accompany debilitating injury. Minor knee problems often respond well to self-care measures or get well on their own. More serious injuries may need medical intervention or even surgery. °SYMPTOMS °The knee is complex. Symptoms of knee problems can vary widely. Some of the problems are: °· Pain with movement and weight bearing. °· Swelling and tenderness. °· Buckling of the knee. °· Inability to straighten or extend your knee. °· Your knee locks and you cannot straighten it. °· Warmth and redness with pain and fever. °· Deformity or dislocation of the kneecap. °DIAGNOSIS  °Determining what is wrong may be very straight forward such as when there is an injury. It can also be challenging because of the complexity of the knee. Tests to make a diagnosis may include: °· Your caregiver taking a history and doing a physical exam. °· Routine X-rays can be used to rule out other problems. X-rays will not reveal a cartilage tear. Some injuries of the knee can be diagnosed by: °· Arthroscopy a surgical technique by which a small video camera is inserted through tiny incisions on the sides of the knee. This procedure is used to examine and repair internal knee joint problems. Tiny instruments can be used during arthroscopy to repair the torn knee cartilage (meniscus). °· Arthrography  is a radiology technique. A contrast liquid is directly injected into the knee joint. Internal structures of the knee joint then become visible on X-ray film. °· An MRI scan is a non X-ray radiology procedure in which magnetic fields and a computer produce two- or three-dimensional images of the inside of the knee. Cartilage tears are often visible using an MRI scanner. MRI scans have largely replaced arthrography in diagnosing cartilage tears of the knee. °· Blood work. °· Examination of the fluid that helps to lubricate the knee joint (synovial fluid). This is done by taking a sample out using a needle and a syringe. °TREATMENT °The treatment of knee problems depends on the cause. Some of these treatments are: °· Depending on the injury, proper casting, splinting, surgery, or physical therapy care will be needed. °· Give yourself adequate recovery time. Do not overuse your joints. If you begin to get sore during workout routines, back off. Slow down or do fewer repetitions. °· For repetitive activities such as cycling or running, maintain your strength and nutrition. °· Alternate muscle groups. For example, if you are a weight lifter, work the upper body on one day and the lower body the next. °· Either tight or weak muscles do not give the proper support for your knee. Tight or weak muscles do not absorb the stress placed on the knee joint. Keep the muscles surrounding the knee strong. °· Take care of mechanical problems. °¨ If you have flat feet, orthotics or special shoes may help.   See your caregiver if you need help. °¨ Arch supports, sometimes with wedges on the inner or outer aspect of the heel, can help. These can shift pressure away from the side of the knee most bothered by osteoarthritis. °¨ A brace called an "unloader" brace also may be used to help ease the pressure on the most arthritic side of the knee. °· If your caregiver has prescribed crutches, braces, wraps or ice, use as directed. The acronym  for this is PRICE. This means protection, rest, ice, compression, and elevation. °· Nonsteroidal anti-inflammatory drugs (NSAIDs), can help relieve pain. But if taken immediately after an injury, they may actually increase swelling. Take NSAIDs with food in your stomach. Stop them if you develop stomach problems. Do not take these if you have a history of ulcers, stomach pain, or bleeding from the bowel. Do not take without your caregiver's approval if you have problems with fluid retention, heart failure, or kidney problems. °· For ongoing knee problems, physical therapy may be helpful. °· Glucosamine and chondroitin are over-the-counter dietary supplements. Both may help relieve the pain of osteoarthritis in the knee. These medicines are different from the usual anti-inflammatory drugs. Glucosamine may decrease the rate of cartilage destruction. °· Injections of a corticosteroid drug into your knee joint may help reduce the symptoms of an arthritis flare-up. They may provide pain relief that lasts a few months. You may have to wait a few months between injections. The injections do have a small increased risk of infection, water retention, and elevated blood sugar levels. °· Hyaluronic acid injected into damaged joints may ease pain and provide lubrication. These injections may work by reducing inflammation. A series of shots may give relief for as long as 6 months. °· Topical painkillers. Applying certain ointments to your skin may help relieve the pain and stiffness of osteoarthritis. Ask your pharmacist for suggestions. Many over the-counter products are approved for temporary relief of arthritis pain. °· In some countries, doctors often prescribe topical NSAIDs for relief of chronic conditions such as arthritis and tendinitis. A review of treatment with NSAID creams found that they worked as well as oral medications but without the serious side effects. °PREVENTION °· Maintain a healthy weight. Extra pounds  put more strain on your joints. °· Get strong, stay limber. Weak muscles are a common cause of knee injuries. Stretching is important. Include flexibility exercises in your workouts. °· Be smart about exercise. If you have osteoarthritis, chronic knee pain or recurring injuries, you may need to change the way you exercise. This does not mean you have to stop being active. If your knees ache after jogging or playing basketball, consider switching to swimming, water aerobics, or other low-impact activities, at least for a few days a week. Sometimes limiting high-impact activities will provide relief. °· Make sure your shoes fit well. Choose footwear that is right for your sport. °· Protect your knees. Use the proper gear for knee-sensitive activities. Use kneepads when playing volleyball or laying carpet. Buckle your seat belt every time you drive. Most shattered kneecaps occur in car accidents. °· Rest when you are tired. °SEEK MEDICAL CARE IF:  °You have knee pain that is continual and does not seem to be getting better.  °SEEK IMMEDIATE MEDICAL CARE IF:  °Your knee joint feels hot to the touch and you have a high fever. °MAKE SURE YOU:  °· Understand these instructions. °· Will watch your condition. °· Will get help right away if you are not   doing well or get worse. °Document Released: 12/26/2006 Document Revised: 05/23/2011 Document Reviewed: 12/26/2006 °[employer]® Patient Information ©2015 [employer]. This information is not intended to replace advice given to you by your health care provider. Make sure you discuss any questions you have with your health care provider. ° °Arthralgia °Your caregiver has diagnosed you as suffering from an arthralgia. Arthralgia means there is pain in a joint. This can come from many reasons including: °· Bruising the joint which causes soreness (inflammation) in the joint. °· Wear and tear on the joints which occur as we grow older (osteoarthritis). °· Overusing the  joint. °· Various forms of arthritis. °· Infections of the joint. °Regardless of the cause of pain in your joint, most of these different pains respond to anti-inflammatory drugs and rest. The exception to this is when a joint is infected, and these cases are treated with antibiotics, if it is a bacterial infection. °HOME CARE INSTRUCTIONS  °· Rest the injured area for as long as directed by your caregiver. Then slowly start using the joint as directed by your caregiver and as the pain allows. Crutches as directed may be useful if the ankles, knees or hips are involved. If the knee was splinted or casted, continue use and care as directed. If an stretchy or elastic wrapping bandage has been applied today, it should be removed and re-applied every 3 to 4 hours. It should not be applied tightly, but firmly enough to keep swelling down. Watch toes and feet for swelling, bluish discoloration, coldness, numbness or excessive pain. If any of these problems (symptoms) occur, remove the ace bandage and re-apply more loosely. If these symptoms persist, contact your caregiver or return to this location. °· For the first 24 hours, keep the injured extremity elevated on pillows while lying down. °· Apply ice for 15-20 minutes to the sore joint every couple hours while awake for the first half day. Then 03-04 times per day for the first 48 hours. Put the ice in a plastic bag and place a towel between the bag of ice and your skin. °· Wear any splinting, casting, elastic bandage applications, or slings as instructed. °· Only take over-the-counter or prescription medicines for pain, discomfort, or fever as directed by your caregiver. Do not use aspirin immediately after the injury unless instructed by your physician. Aspirin can cause increased bleeding and bruising of the tissues. °· If you were given crutches, continue to use them as instructed and do not resume weight bearing on the sore joint until instructed. °Persistent pain  and inability to use the sore joint as directed for more than 2 to 3 days are warning signs indicating that you should see a caregiver for a follow-up visit as soon as possible. Initially, a hairline fracture (break in bone) may not be evident on X-rays. Persistent pain and swelling indicate that further evaluation, non-weight bearing or use of the joint (use of crutches or slings as instructed), or further X-rays are indicated. X-rays may sometimes not show a small fracture until a week or 10 days later. Make a follow-up appointment with your own caregiver or one to whom we have referred you. A radiologist (specialist in reading X-rays) may read your X-rays. Make sure you know how you are to obtain your X-ray results. Do not assume everything is normal if you do not hear from us. °SEEK MEDICAL CARE IF: °Bruising, swelling, or pain increases. °SEEK IMMEDIATE MEDICAL CARE IF:  °· Your fingers or toes are numb   or blue. °· The pain is not responding to medications and continues to stay the same or get worse. °· The pain in your joint becomes severe. °· You develop a fever over 102° F (38.9° C). °· It becomes impossible to move or use the joint. °MAKE SURE YOU:  °· Understand these instructions. °· Will watch your condition. °· Will get help right away if you are not doing well or get worse. °Document Released: 02/28/2005 Document Revised: 05/23/2011 Document Reviewed: 10/17/2007 °[employer]® Patient Information ©2015 [employer]. This information is not intended to replace advice given to you by your health care provider. Make sure you discuss any questions you have with your health care provider. ° °

## 2014-12-11 NOTE — ED Provider Notes (Signed)
[facility] Emergency Department Provider Note  ____________________________________________  Time seen: Approximately 1:39 PM  I have reviewed the triage vital signs and the nursing notes.   HISTORY  Chief Complaint No chief complaint on file.    HPI Jillian Vazquez is a 54 y.o. female who presents for evaluation of left knee pain 8 days. Patient denies any injury or trauma. Taken nothing for the pain. Patient states that she can walk but she walks with difficulty. She feels like a bone is popping in her knee.  Past Medical History  Diagnosis Date  . Anxiety and depression   . GERD (gastroesophageal reflux disease)   . CVA (cerebral infarction)   . Small bowel obstruction     hx  . DVT (deep venous thrombosis)     W/ PE    There are no active problems to display for this patient.   Past Surgical History  Procedure Laterality Date  . Cardiac catheterization  12/14    [facility]    Current Outpatient Rx  Name  Route  Sig  Dispense  Refill  . citalopram (CELEXA) 20 MG tablet   Oral   Take 20 mg by mouth daily.         Marland Kitchen warfarin (COUMADIN) 5 MG tablet   Oral   Take 5 mg by mouth daily.         Marland Kitchen acetaminophen-codeine (TYLENOL #3) 300-30 MG tablet   Oral   Take 2 tablets by mouth every 4 (four) hours as needed for moderate pain.   30 tablet   0   . amoxicillin (AMOXIL) 500 MG capsule   Oral   Take 1 capsule (500 mg total) by mouth 3 (three) times daily.   30 capsule   0   . aspirin 81 MG tablet   Oral   Take 81 mg by mouth daily.         . clonazePAM (KLONOPIN) 0.5 MG tablet   Oral   Take 0.5 mg by mouth 3 (three) times daily as needed for anxiety.         . iron polysaccharides (NIFEREX) 150 MG capsule   Oral   Take 150 mg by mouth 2 (two) times daily.         Marland Kitchen lidocaine (XYLOCAINE) 2 % solution      Apply to cotton ball and apply to dental pain q 4 hours   100 mL   0     30 ml   . lovastatin (MEVACOR) 40 MG  tablet   Oral   Take 40 mg by mouth at bedtime.         . metoprolol tartrate (LOPRESSOR) 25 MG tablet   Oral   Take 25 mg by mouth 2 (two) times daily.         Marland Kitchen omeprazole (PRILOSEC) 20 MG capsule   Oral   Take 20 mg by mouth daily.         . vitamin B-12 (CYANOCOBALAMIN) 1000 MCG tablet   Oral   Take 1,000 mcg by mouth daily.           Allergies Percocet and Vicodin  No family history on file.  Social History Social History  Substance Use Topics  . Smoking status: Never Smoker   . Smokeless tobacco: None  . Alcohol Use: Yes     Comment: ocassional    Review of Systems Constitutional: No fever/chills Eyes: No visual changes. ENT: No sore throat. Cardiovascular: Denies  chest pain. Respiratory: Denies shortness of breath. Gastrointestinal: No abdominal pain.  No nausea, no vomiting.  No diarrhea.  No constipation. Genitourinary: Negative for dysuria. Musculoskeletal: Positive for left knee pain. Skin: Negative for rash. Neurological: Negative for headaches, focal weakness or numbness.  10-point ROS otherwise negative.  ____________________________________________   PHYSICAL EXAM:  VITAL SIGNS: ED Triage Vitals  Enc Vitals Group     BP 12/11/14 1231 160/87 mmHg     Pulse Rate 12/11/14 1231 89     Resp 12/11/14 1231 18     Temp 12/11/14 1231 98.2 F (36.8 C)     Temp Source 12/11/14 1231 Oral     SpO2 12/11/14 1231 96 %     Weight 12/11/14 1231 244 lb (110.678 kg)     Height 12/11/14 1231  (1.651 m)     Head Cir --      Peak Flow --      Pain Score 12/11/14 1300 10     Pain Loc --      Pain Edu? --      Excl. in GC? --     Constitutional: Alert and oriented. Well appearing and in no acute distress. Eyes: Conjunctivae are normal. PERRL. EOMI. Head: Atraumatic. Nose: No congestion/rhinnorhea. Mouth/Throat: Mucous membranes are moist.  Oropharynx non-erythematous. Neck: No stridor.   Cardiovascular: Normal rate, regular rhythm.  Grossly normal heart sounds.  Good peripheral circulation. Respiratory: Normal respiratory effort.  No retractions. Lungs CTAB. Gastrointestinal: Soft and nontender. No distention. No abdominal bruits. No CVA tenderness. Musculoskeletal: Left knee positive warm tenderness and edema. Neurologic:  Normal speech and language. No gross focal neurologic deficits are appreciated. No gait instability. Skin:  Skin is warm, dry and intact. No rash noted. Psychiatric: Mood and affect are normal. Speech and behavior are normal.  ____________________________________________   LABS (all labs ordered are listed, but only abnormal results are displayed)  Labs Reviewed - No data to display ____________________________________________ RADIOLOGY  ____________________________________________   PROCEDURES  Procedure(s) performed: None  Critical Care performed: No  ____________________________________________   INITIAL IMPRESSION / ASSESSMENT AND PLAN / ED COURSE  Pertinent labs & imaging results that were available during my care of the patient were reviewed by me and considered in my medical decision making (see chart for details).  Acute left knee pain with bone spurs noted. Patient via an Rx for Tylenol No. 3 one to 2 tablets every 4-6 hours as needed for pain she is to follow up with orthopedic doctor as referred. She denies any other symptomology at this time ____________________________________________   FINAL CLINICAL IMPRESSION(S) / ED DIAGNOSES  Final diagnoses:  Knee pain, acute, left       Evangeline Dakin, PA-C 12/11/14 1752  Gayla Doss, MD 12/11/14 2201

## 2015-08-21 ENCOUNTER — Emergency Department: Payer: Self-pay

## 2015-08-21 ENCOUNTER — Emergency Department
Admission: EM | Admit: 2015-08-21 | Discharge: 2015-08-21 | Disposition: A | Discharge status: Home / Self Care | Payer: Self-pay | Attending: Emergency Medicine | Admitting: Emergency Medicine

## 2015-08-21 ENCOUNTER — Encounter: Payer: Self-pay | Admitting: Emergency Medicine

## 2015-08-21 DIAGNOSIS — F419 Anxiety disorder, unspecified: Secondary | ICD-10-CM | POA: Insufficient documentation

## 2015-08-21 DIAGNOSIS — Z8679 Personal history of other diseases of the circulatory system: Secondary | ICD-10-CM | POA: Insufficient documentation

## 2015-08-21 DIAGNOSIS — Z8673 Personal history of transient ischemic attack (TIA), and cerebral infarction without residual deficits: Secondary | ICD-10-CM | POA: Insufficient documentation

## 2015-08-21 DIAGNOSIS — Z792 Long term (current) use of antibiotics: Secondary | ICD-10-CM | POA: Insufficient documentation

## 2015-08-21 DIAGNOSIS — Z7982 Long term (current) use of aspirin: Secondary | ICD-10-CM | POA: Insufficient documentation

## 2015-08-21 DIAGNOSIS — R002 Palpitations: Secondary | ICD-10-CM

## 2015-08-21 DIAGNOSIS — Z79899 Other long term (current) drug therapy: Secondary | ICD-10-CM | POA: Insufficient documentation

## 2015-08-21 DIAGNOSIS — Z7901 Long term (current) use of anticoagulants: Secondary | ICD-10-CM | POA: Insufficient documentation

## 2015-08-21 LAB — PROTIME-INR
INR: 1.38
Prothrombin Time: 17.1 seconds — ABNORMAL HIGH (ref 11.4–15.0)

## 2015-08-21 LAB — CBC
HCT: 42.3 % (ref 35.0–47.0)
Hemoglobin: 14.1 g/dL (ref 12.0–16.0)
MCH: 28.2 pg (ref 26.0–34.0)
MCHC: 33.4 g/dL (ref 32.0–36.0)
MCV: 84.5 fL (ref 80.0–100.0)
PLATELETS: 376 10*3/uL (ref 150–440)
RBC: 5 MIL/uL (ref 3.80–5.20)
RDW: 14 % (ref 11.5–14.5)
WBC: 11.8 10*3/uL — AB (ref 3.6–11.0)

## 2015-08-21 LAB — FIBRIN DERIVATIVES D-DIMER (ARMC ONLY): FIBRIN DERIVATIVES D-DIMER (ARMC): 348 (ref 0–499)

## 2015-08-21 LAB — BASIC METABOLIC PANEL
Anion gap: 6 (ref 5–15)
BUN: 14 mg/dL (ref 6–20)
CHLORIDE: 106 mmol/L (ref 101–111)
CO2: 27 mmol/L (ref 22–32)
CREATININE: 0.67 mg/dL (ref 0.44–1.00)
Calcium: 9.5 mg/dL (ref 8.9–10.3)
GFR calc Af Amer: >60 mL/min (ref >60)
GFR calc non Af Amer: >60 mL/min (ref >60)
Glucose, Bld: 96 mg/dL (ref 65–99)
Potassium: 3.8 mmol/L (ref 3.5–5.1)
SODIUM: 139 mmol/L (ref 135–145)

## 2015-08-21 LAB — TROPONIN I: Troponin I: <0.03 ng/mL (ref <0.031)

## 2015-08-21 MED ORDER — ENOXAPARIN SODIUM 120 MG/0.8ML ~~LOC~~ SOLN
1.0000 mg/kg | Freq: Once | SUBCUTANEOUS | Status: AC
  Administered 2015-08-21: 120 mg via SUBCUTANEOUS
  Filled 2015-08-21: qty 0.8

## 2015-08-21 MED ORDER — DIAZEPAM 5 MG PO TABS: 5.0000 mg | ORAL_TABLET | Freq: Three times a day (TID) | ORAL | Status: DC | PRN

## 2015-08-21 MED ORDER — DIAZEPAM 5 MG PO TABS
10.0000 mg | ORAL_TABLET | Freq: Once | ORAL | Status: AC
  Administered 2015-08-21: 10 mg via ORAL
  Filled 2015-08-21: qty 2

## 2015-08-21 NOTE — ED Provider Notes (Signed)
[facility] Emergency Department Provider Note        Time seen: ----------------------------------------- 4:40 PM on 08/21/2015 -----------------------------------------    I have reviewed the triage vital signs and the nursing notes.   HISTORY  Chief Complaint Hypertension; Palpitations; and Fatigue    HPI Jillian Vazquez is a 55 y.o. female who presents ER for feeling irritable, having palpitations, short of breath and leg swelling for last 3-4 days. Patient reports she checked her blood pressure [employer] and it was elevated. She also has shortness of breath with exertion. She does admit to being under a lot stress. 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   Past Medical History  Diagnosis Date  . Anxiety and depression   . GERD (gastroesophageal reflux disease)   . CVA (cerebral infarction)   . Small bowel obstruction (HCC)     hx  . DVT (deep venous thrombosis) (HCC)     W/ PE    There are no active problems to display for this patient.   Past Surgical History  Procedure Laterality Date  . Cardiac catheterization  12/14    [facility]    Allergies Percocet and Vicodin  Social History Social History  Substance Use Topics  . Smoking status: Never Smoker   . Smokeless tobacco: None  . Alcohol Use: Yes     Comment: ocassional    Review of Systems Constitutional: Negative for fever. Eyes: Negative for visual changes. ENT: Negative for sore throat. Cardiovascular: Negative for chest pain.Positive for palpitations Respiratory: Positive shortness of breath Gastrointestinal: Negative for abdominal pain, vomiting and diarrhea. Genitourinary: Negative for dysuria. Musculoskeletal: Negative for back pain. Skin: Negative for rash. Neurological: Negative for headaches, focal weakness or numbness. Psychiatric: Positive for anxiety  10-point ROS otherwise  negative.  ____________________________________________   PHYSICAL EXAM:  VITAL SIGNS: ED Triage Vitals  Enc Vitals Group     BP 08/21/15 1532 151/86 mmHg     Pulse Rate 08/21/15 1532 102     Resp 08/21/15 1532 22     Temp 08/21/15 1532 98.3 F (36.8 C)     Temp Source 08/21/15 1532 Oral     SpO2 08/21/15 1532 96 %     Weight 08/21/15 1532 260 lb (117.935 kg)     Height 08/21/15 1532 5\' 5"  (1.651 m)     Head Cir --      Peak Flow --      Pain Score 08/21/15 1544 2     Pain Loc --      Pain Edu? --      Excl. in GC? --     Constitutional: Alert and oriented. Anxious, no acute distress Eyes: Conjunctivae are normal. PERRL. Normal extraocular movements. ENT   Head: Normocephalic and atraumatic.   Nose: No congestion/rhinnorhea.   Mouth/Throat: Mucous membranes are moist.   Neck: No stridor. Cardiovascular: Rapid rate, regular rhythm. No murmurs, rubs, or gallops. Respiratory: Normal respiratory effort without tachypnea nor retractions. Breath sounds are clear and equal bilaterally. No wheezes/rales/rhonchi. Gastrointestinal: Soft and nontender. Normal bowel sounds Musculoskeletal: Nontender with normal range of motion in all extremities. No lower extremity tenderness nor edema. Neurologic:  Normal speech and language. No gross focal neurologic deficits are appreciated.  Skin:  Skin is warm, dry and intact. No rash noted. Psychiatric: Mood and affect are normal. Speech and behavior are normal.  ____________________________________________  EKG: Interpreted by me. Sinus rhythm with PACs, rate is 95 bpm, normal PR  interval, normal QRS, normal QT interval. Normal axis.  ____________________________________________  ED COURSE:  Pertinent labs & imaging results that were available during my care of the patient were reviewed by me and considered in my medical decision making (see chart for details). Patient is in no acute distress, appears anxious. I'll check basic  labs, give oral Valium. Patient also does report she has missed several doses of Coumadin because she had run out. ____________________________________________    LABS (pertinent positives/negatives)  Labs Reviewed  CBC - Abnormal; Notable for the following:    WBC 11.8 (*)    All other components within normal limits  PROTIME-INR - Abnormal; Notable for the following:    Prothrombin Time 17.1 (*)    All other components within normal limits  BASIC METABOLIC PANEL  TROPONIN I  FIBRIN DERIVATIVES D-DIMER ([facility] ONLY)   Radiology: Chest x-ray  IMPRESSION: There is no CHF nor other acute cardiopulmonary abnormality.  ____________________________________________  FINAL ASSESSMENT AND PLAN  Anxiety  Plan: Patient with labs and imaging as dictated above. Patient presents to ER mostly with anxiety type symptoms. She is in no acute distress, I will give her a short supply Valium. D-dimer is negative for PE. She was given Lovenox as she is subtherapeutic on Coumadin. I did advise taking an extra Coumadin when she got home.   Emily Filbert, MD   Note: This dictation was prepared with Dragon dictation. Any transcriptional errors that result from this process are unintentional   Emily Filbert, MD 08/21/15 [phone]

## 2015-08-21 NOTE — Discharge Instructions (Signed)
Palpitations °A palpitation is the feeling that your heartbeat is irregular. It may feel like your heart is fluttering or skipping a beat. It may also feel like your heart is beating faster than normal. This is usually not a serious problem. In some cases, you may need more medical tests. °HOME CARE °· Avoid: °· Caffeine in coffee, tea, soft drinks, diet pills, and energy drinks. °· Chocolate. °· Alcohol. °· Stop smoking if you smoke. °· Reduce your stress and anxiety. Try: °· A method that measures bodily functions so you can learn to control them (biofeedback). °· Yoga. °· Meditation. °· Physical activity such as swimming, jogging, or walking. °· Get plenty of rest and sleep. °GET HELP IF: °· Your fast or irregular heartbeat continues after 24 hours. °· Your palpitations occur more often. °GET HELP RIGHT AWAY IF:  °· You have chest pain. °· You feel short of breath. °· You have a very bad headache. °· You feel dizzy or pass out (faint). °MAKE SURE YOU:  °· Understand these instructions. °· Will watch your condition. °· Will get help right away if you are not doing well or get worse. °  °This information is not intended to replace advice given to you by your health care provider. Make sure you discuss any questions you have with your health care provider. °  °Document Released: 12/08/2007 Document Revised: 03/21/2014 Document Reviewed: 04/29/2011 °Elsevier Interactive Patient Education ©2016 [employer] ° °Panic Attacks °Panic attacks are sudden, short feelings of great fear or discomfort. You may have them for no reason when you are relaxed, when you are uneasy (anxious), or when you are sleeping.  °HOME CARE °· Take all your medicines as told. °· Check with your doctor before starting new medicines. °· Keep all doctor visits. °GET HELP IF: °· You are not able to take your medicines as told. °· Your symptoms do not get better. °· Your symptoms get worse. °GET HELP RIGHT AWAY IF: °· Your attacks seem different  than your normal attacks. °· You have thoughts about hurting yourself or others. °· You take panic attack medicine and you have a side effect. °MAKE SURE YOU: °· Understand these instructions. °· Will watch your condition. °· Will get help right away if you are not doing well or get worse. °  °This information is not intended to replace advice given to you by your health care provider. Make sure you discuss any questions you have with your health care provider. °  °Document Released: 04/02/2010 Document Revised: 12/19/2012 Document Reviewed: 10/12/2012 °Elsevier Interactive Patient Education ©2016 [employer] ° °

## 2015-08-21 NOTE — ED Notes (Signed)
Pt states feeling tired and irritable and "just bad" for the past 3- 4 days, states she had her BP checked at [facility] and states it was "150 over something", states feeling palpatations

## 2015-08-21 NOTE — ED Notes (Signed)
Pt presents to ED with reports of elevated blood pressure, fatigue, irritability, ankle swelling and palpitations. Pt states when she has to walk far she gives out of breath.

## 2016-04-09 ENCOUNTER — Encounter: Payer: Self-pay | Admitting: *Deleted

## 2016-04-09 ENCOUNTER — Emergency Department
Admission: EM | Admit: 2016-04-09 | Discharge: 2016-04-09 | Disposition: A | Discharge status: Home / Self Care | Payer: Self-pay | Attending: Emergency Medicine | Admitting: Emergency Medicine

## 2016-04-09 DIAGNOSIS — Z7901 Long term (current) use of anticoagulants: Secondary | ICD-10-CM | POA: Insufficient documentation

## 2016-04-09 DIAGNOSIS — Z7982 Long term (current) use of aspirin: Secondary | ICD-10-CM | POA: Insufficient documentation

## 2016-04-09 DIAGNOSIS — H7291 Unspecified perforation of tympanic membrane, right ear: Secondary | ICD-10-CM | POA: Insufficient documentation

## 2016-04-09 MED ORDER — OFLOXACIN 0.3 % OT SOLN: 5.0000 [drp] | Freq: Every day | OTIC | 0 refills | Status: DC

## 2016-04-09 NOTE — ED Triage Notes (Signed)
Pt to ED reporting bleeding from right ear since using a Qtip this morning. Pt denies severe pain and reports muffled hearing. Bleeding is controled at this time with cotton ball from home. Pt alert and oriented x 4.

## 2016-04-09 NOTE — ED Provider Notes (Signed)
[facility] Emergency Department Provider Note  ____________________________________________  Time seen: Approximately 7:57 PM  I have reviewed the triage vital signs and the nursing notes.   HISTORY  Chief Complaint Ear Drainage    HPI Jillian Vazquez is a 56 y.o. female , NAD, presents to emergency department for evaluation of bleeding from the right ear canal. Patient states she was cleansing her right ear canal with a Q-tip and then noticed bleeding. Had a sharp pain and believes she may have perforated the eardrum. Denies any other injury or trauma to the head or neck. Denies any fevers, chills or body aches. Had no dizziness, lightheadedness. Does note that the hearing is muffled in the right ear but has not had any loss of hearing. Denies any tinnitus. The injury denies any ear pain, ear drainage, nasal congestion, runny nose.   Past Medical History:  Diagnosis Date  . Anxiety and depression   . CVA (cerebral infarction)   . DVT (deep venous thrombosis) (HCC)    W/ PE  . GERD (gastroesophageal reflux disease)   . Small bowel obstruction    hx    There are no active problems to display for this patient.   Past Surgical History:  Procedure Laterality Date  . CARDIAC CATHETERIZATION  12/14   [facility]    Prior to Admission medications   Medication Sig Start Date End Date Taking? Authorizing Provider  acetaminophen-codeine (TYLENOL #3) 300-30 MG tablet Take 2 tablets by mouth every 4 (four) hours as needed for moderate pain. 12/11/14   Charmayne Sheerharles M Beers, PA-C  amoxicillin (AMOXIL) 500 MG capsule Take 1 capsule (500 mg total) by mouth 3 (three) times daily. 10/08/14   Tommi Rumpshonda L Summers, PA-C  aspirin 81 MG tablet Take 81 mg by mouth daily.    Historical Provider, MD  citalopram (CELEXA) 20 MG tablet Take 20 mg by mouth daily.    Historical Provider, MD  clonazePAM (KLONOPIN) 0.5 MG tablet Take 0.5 mg by mouth 3 (three) times daily as needed for anxiety.     Historical Provider, MD  diazepam (VALIUM) 5 MG tablet Take 1 tablet (5 mg total) by mouth every 8 (eight) hours as needed for anxiety. 08/21/15   Emily FilbertJonathan E Williams, MD  iron polysaccharides (NIFEREX) 150 MG capsule Take 150 mg by mouth 2 (two) times daily.    Historical Provider, MD  lidocaine (XYLOCAINE) 2 % solution Apply to cotton ball and apply to dental pain q 4 hours 10/08/14   Tommi Rumpshonda L Summers, PA-C  lovastatin (MEVACOR) 40 MG tablet Take 40 mg by mouth at bedtime.    Historical Provider, MD  metoprolol tartrate (LOPRESSOR) 25 MG tablet Take 25 mg by mouth 2 (two) times daily.    Historical Provider, MD  ofloxacin (FLOXIN OTIC) 0.3 % otic solution Place 5 drops into the right ear daily. 04/09/16   Jami L Hagler, PA-C  omeprazole (PRILOSEC) 20 MG capsule Take 20 mg by mouth daily.    Historical Provider, MD  vitamin B-12 (CYANOCOBALAMIN) 1000 MCG tablet Take 1,000 mcg by mouth daily.    Historical Provider, MD  warfarin (COUMADIN) 5 MG tablet Take 5 mg by mouth daily.    Historical Provider, MD    Allergies Percocet [oxycodone-acetaminophen] and Vicodin [hydrocodone-acetaminophen]  No family history on file.  Social History Social History  Substance Use Topics  . Smoking status: Never Smoker  . Smokeless tobacco: Never Used  . Alcohol use Yes     Comment: ocassional  Review of Systems  Constitutional: No fever/chills ENT: Positive bleeding right ear canal, muffled hearing. No sore throat. Cardiovascular: No chest pain. Respiratory: No cough. No shortness of breath.  Musculoskeletal: Negative for neck pain.  Skin: Negative for rash, redness, swelling. Neurological: Negative for headaches  ____________________________________________   PHYSICAL EXAM:  VITAL SIGNS: ED Triage Vitals  Enc Vitals Group     BP 04/09/16 1851 (!) 156/86     Pulse Rate 04/09/16 1851 88     Resp 04/09/16 1851 18     Temp 04/09/16 1851 98.2 F (36.8 C)     Temp Source 04/09/16 1851 Oral      SpO2 04/09/16 1851 96 %     Weight 04/09/16 1848 260 lb (117.9 kg)     Height 04/09/16 1848 5\' 5"  (1.651 m)     Head Circumference --      Peak Flow --      Pain Score 04/09/16 1848 3     Pain Loc --      Pain Edu? --      Excl. in GC? --     Constitutional: Alert and oriented. Well appearing and in no acute distress. Eyes: Conjunctivae are normal.  Head: Atraumatic. ENT:      Ears: Right external ear canal noted with blood in the canal. Right TM is visualized without effusion but the right lower TM cannot be visualized at the 4:00 to 5:00 area. No pain with manipulation of the right tragus or pinna. Hearing is grossly intact about the right ear.      Nose: No congestion/rhinnorhea.      Mouth/Throat: Mucous membranes are moist. No postnasal drainage. Neck: Supple with full range of motion. Hematological/Lymphatic/Immunilogical: No cervical lymphadenopathy. Cardiovascular: Good peripheral circulation. Respiratory: Normal respiratory effort without tachypnea or retractions.  Neurologic:  Normal speech and language. No gross focal neurologic deficits are appreciated.  Skin:  Skin is warm, dry and intact. No rash, redness, swelling noted. Psychiatric: Mood and affect are normal. Speech and behavior are normal. Patient exhibits appropriate insight and judgement.   ____________________________________________   LABS  None ____________________________________________  EKG  None ____________________________________________  RADIOLOGY  None ____________________________________________    PROCEDURES  Procedure(s) performed: None   Procedures   Medications - No data to display   ____________________________________________   INITIAL IMPRESSION / ASSESSMENT AND PLAN / ED COURSE  Pertinent labs & imaging results that were available during my care of the patient were reviewed by me and considered in my medical decision making (see chart for details).      Patient's diagnosis is consistent with perforation of right tympanic membrane. Patient will be discharged home with prescriptions for ofloxacin Rx solution to use as directed. Patient is to follow up with ENT in 1 week for further evaluation and treatment of perforation. Discussed with patient that she may not allow water to get in the right ear until seen by ENT and cleared. Patient is given ED precautions to return to the ED for any worsening or new symptoms.   ____________________________________________  FINAL CLINICAL IMPRESSION(S) / ED DIAGNOSES  Final diagnoses:  Perforation of right tympanic membrane      NEW MEDICATIONS STARTED DURING THIS VISIT:  Discharge Medication List as of 04/09/2016  8:14 PM    START taking these medications   Details  ofloxacin (FLOXIN OTIC) 0.3 % otic solution Place 5 drops into the right ear daily., Starting Sat 04/09/2016, Print  Hope Pigeon, PA-C 04/09/16 2055    Emily Filbert, MD 04/09/16 2223

## 2017-04-27 ENCOUNTER — Emergency Department
Admission: EM | Admit: 2017-04-27 | Discharge: 2017-04-27 | Disposition: A | Discharge status: Home / Self Care | Payer: Self-pay | Attending: Emergency Medicine | Admitting: Emergency Medicine

## 2017-04-27 ENCOUNTER — Other Ambulatory Visit: Payer: Self-pay

## 2017-04-27 DIAGNOSIS — M25561 Pain in right knee: Secondary | ICD-10-CM | POA: Insufficient documentation

## 2017-04-27 DIAGNOSIS — M545 Low back pain, unspecified: Secondary | ICD-10-CM

## 2017-04-27 DIAGNOSIS — Z7982 Long term (current) use of aspirin: Secondary | ICD-10-CM | POA: Insufficient documentation

## 2017-04-27 DIAGNOSIS — R300 Dysuria: Secondary | ICD-10-CM | POA: Insufficient documentation

## 2017-04-27 DIAGNOSIS — Z8673 Personal history of transient ischemic attack (TIA), and cerebral infarction without residual deficits: Secondary | ICD-10-CM | POA: Insufficient documentation

## 2017-04-27 DIAGNOSIS — Z7901 Long term (current) use of anticoagulants: Secondary | ICD-10-CM | POA: Insufficient documentation

## 2017-04-27 DIAGNOSIS — Z79899 Other long term (current) drug therapy: Secondary | ICD-10-CM | POA: Insufficient documentation

## 2017-04-27 LAB — URINALYSIS, COMPLETE (UACMP) WITH MICROSCOPIC
Bacteria, UA: NONE SEEN
Bilirubin Urine: NEGATIVE
Glucose, UA: NEGATIVE mg/dL
Ketones, ur: NEGATIVE mg/dL
Nitrite: NEGATIVE
PH: 5 (ref 5.0–8.0)
Protein, ur: NEGATIVE mg/dL
SPECIFIC GRAVITY, URINE: 1.026 (ref 1.005–1.030)

## 2017-04-27 MED ORDER — CEPHALEXIN 500 MG PO CAPS: 500.0000 mg | ORAL_CAPSULE | Freq: Two times a day (BID) | ORAL | 0 refills | Status: AC

## 2017-04-27 MED ORDER — CYCLOBENZAPRINE HCL 5 MG PO TABS: 5.0000 mg | ORAL_TABLET | Freq: Three times a day (TID) | ORAL | 0 refills | Status: DC | PRN

## 2017-04-27 NOTE — Discharge Instructions (Signed)
Your exam is consistent with right-sided low back pain. These symptoms could be related to an early kidney infection. The urine lab is concerning for an infection. Take the antibiotic as directed. Take the muscle relaxant as needed.

## 2017-04-27 NOTE — ED Provider Notes (Signed)
[facility] Emergency Department Provider Note ____________________________________________  Time seen: 1559  I have reviewed the triage vital signs and the nursing notes.  HISTORY  Chief Complaint  Back Pain  HPI Jillian Vazquez is a 57 y.o. female presents herself to the ED for evaluation of a one-week complaint of intermittent low back pain.  Patient denies any preceding injury, accident, trauma, or fall.  She does admit to some mild dysuria. She localizes the pain to her right LS region. She has a history of DJD of the knees and notes right knee pain. She denies any distal paresthesias, footdrop, or incontinence.  She also denies any saddle anesthesia, lower extremity edema, or chest pain. She take warfarin daily and admits to being poorly compliant with BP meds and diuretics.   Past Medical History:  Diagnosis Date  . Anxiety and depression   . CVA (cerebral infarction)   . DVT (deep venous thrombosis) (HCC)    W/ PE  . GERD (gastroesophageal reflux disease)   . Small bowel obstruction (HCC)    hx    There are no active problems to display for this patient.   Past Surgical History:  Procedure Laterality Date  . CARDIAC CATHETERIZATION  12/14   [facility]    Prior to Admission medications   Medication Sig Start Date End Date Taking? Authorizing Provider  acetaminophen-codeine (TYLENOL #3) 300-30 MG tablet Take 2 tablets by mouth every 4 (four) hours as needed for moderate pain. 12/11/14   Beers, Charmayne Sheerharles M, PA-C  amoxicillin (AMOXIL) 500 MG capsule Take 1 capsule (500 mg total) by mouth 3 (three) times daily. 10/08/14   Tommi RumpsSummers, Rhonda L, PA-C  aspirin 81 MG tablet Take 81 mg by mouth daily.    [provider]  cephALEXin (KEFLEX) 500 MG capsule Take 1 capsule (500 mg total) by mouth 2 (two) times daily for 7 days. 04/27/17 05/04/17  Kellsie Grindle, Charlesetta IvoryJenise V Bacon, PA-C  citalopram (CELEXA) 20 MG tablet Take 20 mg by mouth daily.    [provider]   clonazePAM (KLONOPIN) 0.5 MG tablet Take 0.5 mg by mouth 3 (three) times daily as needed for anxiety.    [provider]  cyclobenzaprine (FLEXERIL) 5 MG tablet Take 1 tablet (5 mg total) by mouth 3 (three) times daily as needed for muscle spasms. 04/27/17   Zebulon Gantt, Charlesetta IvoryJenise V Bacon, PA-C  diazepam (VALIUM) 5 MG tablet Take 1 tablet (5 mg total) by mouth every 8 (eight) hours as needed for anxiety. 08/21/15   Emily FilbertWilliams, Jonathan E, MD  iron polysaccharides (NIFEREX) 150 MG capsule Take 150 mg by mouth 2 (two) times daily.    [provider]  lidocaine (XYLOCAINE) 2 % solution Apply to cotton ball and apply to dental pain q 4 hours 10/08/14   Tommi RumpsSummers, Rhonda L, PA-C  lovastatin (MEVACOR) 40 MG tablet Take 40 mg by mouth at bedtime.    [provider]  metoprolol tartrate (LOPRESSOR) 25 MG tablet Take 25 mg by mouth 2 (two) times daily.    [provider]  ofloxacin (FLOXIN OTIC) 0.3 % otic solution Place 5 drops into the right ear daily. 04/09/16   Hagler, Jami L, PA-C  omeprazole (PRILOSEC) 20 MG capsule Take 20 mg by mouth daily.    [provider]  vitamin B-12 (CYANOCOBALAMIN) 1000 MCG tablet Take 1,000 mcg by mouth daily.    [provider]  warfarin (COUMADIN) 5 MG tablet Take 5 mg by mouth daily.  [provider]    Allergies Percocet [oxycodone-acetaminophen] and Vicodin [hydrocodone-acetaminophen]  No family history on file.  Social History Social History   Tobacco Use  . Smoking status: Never Smoker  . Smokeless tobacco: Never Used  Substance Use Topics  . Alcohol use: No    Frequency: Never  . Drug use: No    Review of Systems  Constitutional: Negative for fever. Cardiovascular: Negative for chest pain. Respiratory: Negative for shortness of breath. Gastrointestinal: Negative for abdominal pain, vomiting and diarrhea. Genitourinary: Positive for dysuria. Musculoskeletal: Significant for back pain. Skin:  Negative for rash. Neurological: Negative for headaches, focal weakness or numbness. ____________________________________________  PHYSICAL EXAM:  VITAL SIGNS: ED Triage Vitals  Enc Vitals Group     BP 04/27/17 1525 (!) 144/97     Pulse Rate 04/27/17 1525 91     Resp 04/27/17 1525 16     Temp 04/27/17 1525 97.8 F (36.6 C)     Temp Source 04/27/17 1525 Oral     SpO2 04/27/17 1525 96 %     Weight 04/27/17 1526 270 lb (122.5 kg)     Height 04/27/17 1526 5\' 5"  (1.651 m)     Head Circumference --      Peak Flow --      Pain Score 04/27/17 1525 10     Pain Loc --      Pain Edu? --      Excl. in GC? --     Constitutional: Alert and oriented. Well appearing and in no distress. Head: Normocephalic and atraumatic. Cardiovascular: Normal rate, regular rhythm. Normal distal pulses. Respiratory: Normal respiratory effort. No wheezes/rales/rhonchi. Gastrointestinal: Soft and nontender. No distention.  Normal bowel sounds x4.  No CVA tenderness appreciated. Musculoskeletal: Spinal alignment without midline tenderness, spasm, deformity, or step-off.  Patient transitions from sit to stand without assistance.  She is able to demonstrate normal lumbar flexion and extension range.  Nontender with normal range of motion in all extremities.  Neurologic: Cranial nerves II through XII grossly intact.  Normal LE DTRs bilaterally.  Normal toe dorsiflexion and foot eversion on exam.  Negative Trendelenburg.  Normal gait without ataxia. Normal speech and language. No gross focal neurologic deficits are appreciated. Skin:  Skin is warm, dry and intact. No rash noted. Psychiatric: Mood and affect are normal. Patient exhibits appropriate insight and judgment. ____________________________________________   LABS (pertinent positives/negatives)  Labs Reviewed  URINALYSIS, COMPLETE (UACMP) WITH MICROSCOPIC - Abnormal; Notable for the following components:      Result Value   Color, Urine YELLOW (*)     APPearance HAZY (*)    Hgb urine dipstick MODERATE (*)    Leukocytes, UA TRACE (*)    Squamous Epithelial / LPF 0-5 (*)    All other components within normal limits  ____________________________________________  INITIAL IMPRESSION / ASSESSMENT AND PLAN / ED COURSE  Patient with a 1-week complaint of intermittent right-sided lower back. She has also had some mild dysuria. She is aware of her mild hematuria/leukocyturia. She has a normal exam, otherwise, without evidence of acute neuromuscular deficits. She is discharged with prescriptions for Keflex and cyclobenzaprine. She will follow-up with her provider as needed.  ____________________________________________  FINAL CLINICAL IMPRESSION(S) / ED DIAGNOSES  Final diagnoses:  Dysuria  Acute right-sided low back pain without sciatica      Lissa Hoard, PA-C 04/27/17 1743    Myrna Blazer, MD 04/27/17 2001

## 2017-04-27 NOTE — ED Triage Notes (Signed)
Pt reports lower back pain that started 1 week ago with no injury that she can recall

## 2017-05-29 ENCOUNTER — Other Ambulatory Visit: Payer: Self-pay

## 2017-05-29 ENCOUNTER — Emergency Department
Admission: EM | Admit: 2017-05-29 | Discharge: 2017-05-29 | Disposition: A | Discharge status: Home / Self Care | Payer: Self-pay | Attending: Student in an Organized Health Care Education/Training Program | Admitting: Student in an Organized Health Care Education/Training Program

## 2017-05-29 ENCOUNTER — Emergency Department: Payer: Self-pay

## 2017-05-29 ENCOUNTER — Encounter: Payer: Self-pay | Admitting: Emergency Medicine

## 2017-05-29 DIAGNOSIS — R109 Unspecified abdominal pain: Secondary | ICD-10-CM

## 2017-05-29 DIAGNOSIS — Z8673 Personal history of transient ischemic attack (TIA), and cerebral infarction without residual deficits: Secondary | ICD-10-CM | POA: Insufficient documentation

## 2017-05-29 DIAGNOSIS — R110 Nausea: Secondary | ICD-10-CM | POA: Insufficient documentation

## 2017-05-29 DIAGNOSIS — Z7982 Long term (current) use of aspirin: Secondary | ICD-10-CM | POA: Insufficient documentation

## 2017-05-29 DIAGNOSIS — K432 Incisional hernia without obstruction or gangrene: Secondary | ICD-10-CM

## 2017-05-29 DIAGNOSIS — Z79899 Other long term (current) drug therapy: Secondary | ICD-10-CM | POA: Insufficient documentation

## 2017-05-29 LAB — COMPREHENSIVE METABOLIC PANEL
ALT: 26 U/L (ref 14–54)
AST: 36 U/L (ref 15–41)
Albumin: 4 g/dL (ref 3.5–5.0)
Alkaline Phosphatase: 73 U/L (ref 38–126)
Anion gap: 14 (ref 5–15)
BILIRUBIN TOTAL: 0.6 mg/dL (ref 0.3–1.2)
BUN: 13 mg/dL (ref 6–20)
CHLORIDE: 102 mmol/L (ref 101–111)
CO2: 20 mmol/L — ABNORMAL LOW (ref 22–32)
CREATININE: 0.64 mg/dL (ref 0.44–1.00)
Calcium: 9.2 mg/dL (ref 8.9–10.3)
Glucose, Bld: 119 mg/dL — ABNORMAL HIGH (ref 65–99)
POTASSIUM: 3.5 mmol/L (ref 3.5–5.1)
Sodium: 136 mmol/L (ref 135–145)
TOTAL PROTEIN: 8 g/dL (ref 6.5–8.1)

## 2017-05-29 LAB — URINALYSIS, COMPLETE (UACMP) WITH MICROSCOPIC
BILIRUBIN URINE: NEGATIVE
Bacteria, UA: NONE SEEN
GLUCOSE, UA: NEGATIVE mg/dL
Ketones, ur: 5 mg/dL — AB
NITRITE: NEGATIVE
Protein, ur: NEGATIVE mg/dL
SPECIFIC GRAVITY, URINE: 1.019 (ref 1.005–1.030)
pH: 6 (ref 5.0–8.0)

## 2017-05-29 LAB — CBC WITH DIFFERENTIAL/PLATELET
BASOS ABS: 0 10*3/uL (ref 0–0.1)
BASOS PCT: 0 %
EOS PCT: 1 %
Eosinophils Absolute: 0.1 10*3/uL (ref 0–0.7)
HCT: 44 % (ref 35.0–47.0)
Hemoglobin: 14.9 g/dL (ref 12.0–16.0)
LYMPHS PCT: 16 %
Lymphs Abs: 1.4 10*3/uL (ref 1.0–3.6)
MCH: 28.2 pg (ref 26.0–34.0)
MCHC: 33.8 g/dL (ref 32.0–36.0)
MCV: 83.5 fL (ref 80.0–100.0)
MONO ABS: 0.3 10*3/uL (ref 0.2–0.9)
Monocytes Relative: 4 %
NEUTROS ABS: 7.3 10*3/uL — AB (ref 1.4–6.5)
Neutrophils Relative %: 79 %
PLATELETS: 422 10*3/uL (ref 150–440)
RBC: 5.27 MIL/uL — AB (ref 3.80–5.20)
RDW: 14.3 % (ref 11.5–14.5)
WBC: 9.2 10*3/uL (ref 3.6–11.0)

## 2017-05-29 LAB — POC URINE PREG, ED: Preg Test, Ur: NEGATIVE

## 2017-05-29 LAB — LIPASE, BLOOD: LIPASE: 28 U/L (ref 11–51)

## 2017-05-29 MED ORDER — FENTANYL CITRATE (PF) 100 MCG/2ML IJ SOLN
100.0000 ug | INTRAMUSCULAR | Status: DC | PRN
  Administered 2017-05-29: 100 ug via INTRAVENOUS
  Filled 2017-05-29: qty 2

## 2017-05-29 MED ORDER — POLYETHYLENE GLYCOL 3350 17 G PO PACK: 17.0000 g | PACK | Freq: Every day | ORAL | 0 refills | Status: DC

## 2017-05-29 MED ORDER — ONDANSETRON HCL 4 MG/2ML IJ SOLN
4.0000 mg | Freq: Once | INTRAMUSCULAR | Status: AC
  Administered 2017-05-29: 4 mg via INTRAVENOUS
  Filled 2017-05-29: qty 2

## 2017-05-29 NOTE — ED Notes (Signed)
The rest of pain medication administered.

## 2017-05-29 NOTE — ED Provider Notes (Signed)
[facility] Emergency Department Provider Note    First MD Initiated Contact with Patient 05/29/17 1013     (approximate)  I have reviewed the triage vital signs and the nursing notes.   HISTORY  Chief Complaint Hernia    HPI Jillian Vazquez is a 57 y.o. female with a history of a ventral hernia presents with a chief complaint of increased suprapubic pain and firmness this morning when she woke up.  States the pain lasted roughly 30-45 minutes and then spontaneously resolved.  Denies any fevers.  Has felt some nausea but no vomiting.  Has had this evaluated in the past for surgical correction but was told it was "too small "denies any other associated pain.  Did not have any pain last night when eating.  Currently states the pain is mild and has significantly improved.  Past Medical History:  Diagnosis Date  . Anxiety and depression   . CVA (cerebral infarction)   . DVT (deep venous thrombosis) (HCC)    W/ PE  . GERD (gastroesophageal reflux disease)   . Small bowel obstruction (HCC)    hx   No family history on file. Past Surgical History:  Procedure Laterality Date  . CARDIAC CATHETERIZATION  12/14   [facility]   There are no active problems to display for this patient.     Prior to Admission medications   Medication Sig Start Date End Date Taking? Authorizing Provider  acetaminophen-codeine (TYLENOL #3) 300-30 MG tablet Take 2 tablets by mouth every 4 (four) hours as needed for moderate pain. 12/11/14   Beers, Charmayne Sheer, PA-C  amoxicillin (AMOXIL) 500 MG capsule Take 1 capsule (500 mg total) by mouth 3 (three) times daily. 10/08/14   Tommi Rumps, PA-C  aspirin 81 MG tablet Take 81 mg by mouth daily.    [provider]  citalopram (CELEXA) 20 MG tablet Take 20 mg by mouth daily.    [provider]  clonazePAM (KLONOPIN) 0.5 MG tablet Take 0.5 mg by mouth 3 (three) times daily as needed for anxiety.    [provider]    cyclobenzaprine (FLEXERIL) 5 MG tablet Take 1 tablet (5 mg total) by mouth 3 (three) times daily as needed for muscle spasms. 04/27/17   Menshew, Charlesetta Ivory, PA-C  diazepam (VALIUM) 5 MG tablet Take 1 tablet (5 mg total) by mouth every 8 (eight) hours as needed for anxiety. 08/21/15   Emily Filbert, MD  iron polysaccharides (NIFEREX) 150 MG capsule Take 150 mg by mouth 2 (two) times daily.    [provider]  lidocaine (XYLOCAINE) 2 % solution Apply to cotton ball and apply to dental pain q 4 hours 10/08/14   Tommi Rumps, PA-C  lovastatin (MEVACOR) 40 MG tablet Take 40 mg by mouth at bedtime.    [provider]  metoprolol tartrate (LOPRESSOR) 25 MG tablet Take 25 mg by mouth 2 (two) times daily.    [provider]  ofloxacin (FLOXIN OTIC) 0.3 % otic solution Place 5 drops into the right ear daily. 04/09/16   Hagler, Jami L, PA-C  omeprazole (PRILOSEC) 20 MG capsule Take 20 mg by mouth daily.    [provider]  vitamin B-12 (CYANOCOBALAMIN) 1000 MCG tablet Take 1,000 mcg by mouth daily.    [provider]  warfarin (COUMADIN) 5 MG tablet Take 5 mg by mouth daily.    [provider]    Allergies Percocet [oxycodone-acetaminophen] and Vicodin [hydrocodone-acetaminophen]  Social History Social History   Tobacco Use  . Smoking status: Never Smoker  . Smokeless tobacco: Never Used  Substance Use Topics  . Alcohol use: No    Frequency: Never  . Drug use: No    Review of Systems Patient denies headaches, rhinorrhea, blurry vision, numbness, shortness of breath, chest pain, edema, cough, abdominal pain, nausea, vomiting, diarrhea, dysuria, fevers, rashes or hallucinations unless otherwise stated above in HPI. ____________________________________________   PHYSICAL EXAM:  VITAL SIGNS: Vitals:   05/29/17 0833 05/29/17 1046  BP: (!) 168/94 (!) 174/90  Pulse: 93 74  Resp: 20 16  Temp: (!) 97.4 F (36.3 C)   SpO2:  95% 98%    Constitutional: Alert and oriented. Well appearing and in no acute distress. Eyes: Conjunctivae are normal.  Head: Atraumatic. Nose: No congestion/rhinnorhea. Mouth/Throat: Mucous membranes are moist.   Neck: No stridor. Painless ROM.  Cardiovascular: Normal rate, regular rhythm. Grossly normal heart sounds.  Good peripheral circulation. Respiratory: Normal respiratory effort.  No retractions. Lungs CTAB. Gastrointestinal: Soft and nontender there is a reducible supra umbilical ventral hernia with normoactive bowel sounds and no overlying erythema there is still a palpable bulge after reduction but pain resolved and patient states this is at her baseline.. No distention. No abdominal bruits. No CVA tenderness. Genitourinary:  Musculoskeletal: No lower extremity tenderness nor edema.  No joint effusions. Neurologic:  Normal speech and language. No gross focal neurologic deficits are appreciated. No facial droop Skin:  Skin is warm, dry and intact. No rash noted. Psychiatric: Mood and affect are normal. Speech and behavior are normal.  ____________________________________________   LABS (all labs ordered are listed, but only abnormal results are displayed)  Results for orders placed or performed during the hospital encounter of 05/29/17 (from the past 24 hour(s))  Comprehensive metabolic panel     Status: Abnormal   Collection Time: 05/29/17  8:37 AM  Result Value Ref Range   Sodium 136 135 - 145 mmol/L   Potassium 3.5 3.5 - 5.1 mmol/L   Chloride 102 101 - 111 mmol/L   CO2 20 (L) 22 - 32 mmol/L   Glucose, Bld 119 (H) 65 - 99 mg/dL   BUN 13 6 - 20 mg/dL   Creatinine, Ser 5.28 0.44 - 1.00 mg/dL   Calcium 9.2 8.9 - 41.3 mg/dL   Total Protein 8.0 6.5 - 8.1 g/dL   Albumin 4.0 3.5 - 5.0 g/dL   AST 36 15 - 41 U/L   ALT 26 14 - 54 U/L   Alkaline Phosphatase 73 38 - 126 U/L   Total Bilirubin 0.6 0.3 - 1.2 mg/dL   GFR calc non Af Amer >60 >60 mL/min   GFR calc Af Amer >60 >60  mL/min   Anion gap 14 5 - 15  Lipase, blood     Status: None   Collection Time: 05/29/17  8:37 AM  Result Value Ref Range   Lipase 28 11 - 51 U/L  CBC with Diff     Status: Abnormal   Collection Time: 05/29/17  8:37 AM  Result Value Ref Range   WBC 9.2 3.6 - 11.0 K/uL   RBC 5.27 (H) 3.80 - 5.20 MIL/uL   Hemoglobin 14.9 12.0 - 16.0 g/dL   HCT 24.4 01.0 - 27.2 %   MCV 83.5 80.0 - 100.0 fL   MCH 28.2 26.0 - 34.0 pg   MCHC 33.8 32.0 - 36.0 g/dL   RDW 53.6 64.4 - 03.4 %   Platelets 422  150 - 440 K/uL   Neutrophils Relative % 79 %   Neutro Abs 7.3 (H) 1.4 - 6.5 K/uL   Lymphocytes Relative 16 %   Lymphs Abs 1.4 1.0 - 3.6 K/uL   Monocytes Relative 4 %   Monocytes Absolute 0.3 0.2 - 0.9 K/uL   Eosinophils Relative 1 %   Eosinophils Absolute 0.1 0 - 0.7 K/uL   Basophils Relative 0 %   Basophils Absolute 0.0 0 - 0.1 K/uL  Urinalysis, Complete w Microscopic     Status: Abnormal   Collection Time: 05/29/17 10:55 AM  Result Value Ref Range   Color, Urine YELLOW (A) YELLOW   APPearance CLEAR (A) CLEAR   Specific Gravity, Urine 1.019 1.005 - 1.030   pH 6.0 5.0 - 8.0   Glucose, UA NEGATIVE NEGATIVE mg/dL   Hgb urine dipstick SMALL (A) NEGATIVE   Bilirubin Urine NEGATIVE NEGATIVE   Ketones, ur 5 (A) NEGATIVE mg/dL   Protein, ur NEGATIVE NEGATIVE mg/dL   Nitrite NEGATIVE NEGATIVE   Leukocytes, UA TRACE (A) NEGATIVE   RBC / HPF 6-30 0 - 5 RBC/hpf   WBC, UA 0-5 0 - 5 WBC/hpf   Bacteria, UA NONE SEEN NONE SEEN   Squamous Epithelial / LPF 0-5 (A) NONE SEEN   Mucus PRESENT   POC urine preg, ED     Status: None   Collection Time: 05/29/17 11:24 AM  Result Value Ref Range   Preg Test, Ur Negative Negative   ____________________________________________  ____________________________________  RADIOLOGY  I personally reviewed all radiographic images ordered to evaluate for the above acute complaints and reviewed radiology reports and findings.  These findings were personally discussed with  the patient.  Please see medical record for radiology report.  ____________________________________________   PROCEDURES  Procedure(s) performed:  Procedures    Critical Care performed: no ____________________________________________   INITIAL IMPRESSION / ASSESSMENT AND PLAN / ED COURSE  Pertinent labs & imaging results that were available during my care of the patient were reviewed by me and considered in my medical decision making (see chart for details).  DDX: incarcerated hernia, obstruction, colitis, perforation  Montez MoritaSylvia M Shimmin is a 57 y.o. who presents to the ED with evidence of reducible hernia.  No obstruction or perforation.  Patient able to tolerate PO.  Blood work reassuring.  No evidence of uti or pyelo.  Discussed strict return precautions.  Have discussed with the patient and available family all diagnostics and treatments performed thus far and all questions were answered to the best of my ability. The patient demonstrates understanding and agreement with plan.       As part of my medical decision making, I reviewed the following data within the electronic MEDICAL RECORD NUMBER Nursing notes reviewed and incorporated, Labs reviewed, notes from prior ED visits.  ____________________________________________   FINAL CLINICAL IMPRESSION(S) / ED DIAGNOSES  Final diagnoses:  Abdominal pain      NEW MEDICATIONS STARTED DURING THIS VISIT:  New Prescriptions   No medications on file     Note:  This document was prepared using Dragon voice recognition software and may include unintentional dictation errors.    Willy Eddyobinson, Griffith Santilli, MD 05/29/17 [phone]

## 2017-05-29 NOTE — ED Triage Notes (Signed)
Awoke this am with abdominal hernia having increased pain and firmness. Large mod firm round raised area upper L abdomen. States has had hernia since 2013.

## 2017-05-31 ENCOUNTER — Ambulatory Visit: Payer: Self-pay | Attending: Oncology

## 2020-08-07 ENCOUNTER — Emergency Department
Admission: EM | Admit: 2020-08-07 | Discharge: 2020-08-07 | Disposition: A | Discharge status: Home / Self Care | Payer: Self-pay | Attending: Emergency Medicine | Admitting: Emergency Medicine

## 2020-08-07 ENCOUNTER — Other Ambulatory Visit: Payer: Self-pay

## 2020-08-07 ENCOUNTER — Emergency Department: Payer: Self-pay

## 2020-08-07 DIAGNOSIS — M4726 Other spondylosis with radiculopathy, lumbar region: Secondary | ICD-10-CM | POA: Insufficient documentation

## 2020-08-07 DIAGNOSIS — Z7901 Long term (current) use of anticoagulants: Secondary | ICD-10-CM | POA: Insufficient documentation

## 2020-08-07 DIAGNOSIS — Z79899 Other long term (current) drug therapy: Secondary | ICD-10-CM | POA: Insufficient documentation

## 2020-08-07 DIAGNOSIS — Z86718 Personal history of other venous thrombosis and embolism: Secondary | ICD-10-CM | POA: Insufficient documentation

## 2020-08-07 DIAGNOSIS — Z7982 Long term (current) use of aspirin: Secondary | ICD-10-CM | POA: Insufficient documentation

## 2020-08-07 HISTORY — DX: Cerebral infarction, unspecified: I63.9

## 2020-08-07 LAB — URINALYSIS, COMPLETE (UACMP) WITH MICROSCOPIC
Bacteria, UA: NONE SEEN
Bilirubin Urine: NEGATIVE
Glucose, UA: NEGATIVE mg/dL
Ketones, ur: NEGATIVE mg/dL
Leukocytes,Ua: NEGATIVE
Nitrite: NEGATIVE
Protein, ur: NEGATIVE mg/dL
Specific Gravity, Urine: 1.019 (ref 1.005–1.030)
pH: 6 (ref 5.0–8.0)

## 2020-08-07 MED ORDER — ACETAMINOPHEN-CODEINE #3 300-30 MG PO TABS: 1.0000 | ORAL_TABLET | Freq: Four times a day (QID) | ORAL | 0 refills | Status: AC | PRN

## 2020-08-07 MED ORDER — METHOCARBAMOL 500 MG PO TABS
ORAL_TABLET | ORAL | 0 refills | Status: DC
Start: 2020-08-07 — End: 2022-03-30

## 2020-08-07 MED ORDER — KETOROLAC TROMETHAMINE 30 MG/ML IJ SOLN
30.0000 mg | Freq: Once | INTRAMUSCULAR | Status: AC
  Administered 2020-08-07: 30 mg via INTRAMUSCULAR
  Filled 2020-08-07: qty 1

## 2020-08-07 NOTE — Discharge Instructions (Addendum)
Follow-up with your primary care provider in [facility] if any continued problems.  A prescription for a muscle relaxant, and pain medication was sent to your pharmacy.  As we talked about do not take the muscle relaxant and pain medication if you plan on driving or operating machinery as it could cause drowsiness and increase your risk for injury.  You may use ice or heat to your back as needed for discomfort.  No evidence of a urinary tract infection was found today.

## 2020-08-07 NOTE — ED Provider Notes (Signed)
[facility] Emergency Department Provider Note  ____________________________________________   Event Date/Time   First MD Initiated Contact with Patient 08/07/20 1158     (approximate)  I have reviewed the triage vital signs and the nursing notes.   HISTORY  Chief Complaint Back Pain   HPI Jillian Vazquez is a 60 y.o. female presents to the ED with complaint of low back pain for the last couple days.  Patient denies any recent injury and states that she has not had any problems with her back in the past.  She has increased pain with ambulation.  She states that she has been taking Tylenol without any relief of her pain.  She denies any urinary symptoms or history of kidney stones.  She rates her pain as a 9 out of 10.       Past Medical History:  Diagnosis Date  . Anxiety and depression   . CVA (cerebral infarction)   . DVT (deep venous thrombosis) (HCC)    W/ PE  . GERD (gastroesophageal reflux disease)   . Small bowel obstruction (HCC)    hx  . Stroke [facility])     There are no problems to display for this patient.   Past Surgical History:  Procedure Laterality Date  . CARDIAC CATHETERIZATION  12/14   [facility]    Prior to Admission medications   Medication Sig Start Date End Date Taking? Authorizing Provider  acetaminophen-codeine (TYLENOL #3) 300-30 MG tablet Take 1 tablet by mouth every 6 (six) hours as needed for moderate pain. 08/07/20  Yes Tommi Rumps, PA-C  methocarbamol (ROBAXIN) 500 MG tablet 1-2 tablets every 6 hours prn muscle spasms. 08/07/20  Yes Tommi Rumps, PA-C  aspirin 81 MG tablet Take 81 mg by mouth daily.    [provider]  citalopram (CELEXA) 20 MG tablet Take 20 mg by mouth daily.    [provider]  clonazePAM (KLONOPIN) 0.5 MG tablet Take 0.5 mg by mouth 3 (three) times daily as needed for anxiety.    [provider]  iron polysaccharides (NIFEREX) 150 MG capsule Take 150 mg by mouth 2  (two) times daily.    [provider]  lidocaine (XYLOCAINE) 2 % solution Apply to cotton ball and apply to dental pain q 4 hours 10/08/14   Tommi Rumps, PA-C  lovastatin (MEVACOR) 40 MG tablet Take 40 mg by mouth at bedtime.    [provider]  metoprolol tartrate (LOPRESSOR) 25 MG tablet Take 25 mg by mouth 2 (two) times daily.    [provider]  omeprazole (PRILOSEC) 20 MG capsule Take 20 mg by mouth daily.    [provider]  vitamin B-12 (CYANOCOBALAMIN) 1000 MCG tablet Take 1,000 mcg by mouth daily.    [provider]  warfarin (COUMADIN) 5 MG tablet Take 5 mg by mouth daily.    [provider]    Allergies Percocet [oxycodone-acetaminophen] and Vicodin [hydrocodone-acetaminophen]  No family history on file.  Social History Social History   Tobacco Use  . Smoking status: Never Smoker  . Smokeless tobacco: Never Used  Substance Use Topics  . Alcohol use: No  . Drug use: No    Review of Systems Constitutional: No fever/chills Eyes: No visual changes. Cardiovascular: Denies chest pain. Respiratory: Denies shortness of breath. Gastrointestinal: No abdominal pain.  No nausea, no vomiting.  No diarrhea. Genitourinary: Negative for dysuria. Musculoskeletal: Positive for low back pain.  Positive left lower extremity pain.  Skin: Negative for rash. Neurological: Negative for focal weakness or numbness. ____________________________________________   PHYSICAL EXAM:  VITAL SIGNS: ED Triage Vitals  Enc Vitals Group     BP 08/07/20 1052 (!) 143/88     Pulse Rate 08/07/20 1052 (!) 47     Resp 08/07/20 1052 18     Temp 08/07/20 1052 98.7 F (37.1 C)     Temp Source 08/07/20 1052 Oral     SpO2 08/07/20 1052 93 %     Weight 08/07/20 1051 270 lb (122.5 kg)     Height 08/07/20 1051 5\' 5"  (1.651 m)     Head Circumference --      Peak Flow --      Pain Score 08/07/20 1051 9     Pain Loc --      Pain Edu? --      Excl.  in [place]? --     Constitutional: Alert and oriented. Well appearing and in no acute distress. Eyes: Conjunctivae are normal.  Head: Atraumatic. Nose: No congestion/rhinnorhea. Neck: No stridor.   Cardiovascular: Normal rate, regular rhythm. Grossly normal heart sounds.  Good peripheral circulation. Respiratory: Normal respiratory effort.  No retractions. Lungs CTAB. Gastrointestinal: Soft and nontender. No distention.  Bowel sounds normoactive x4 quadrants.   Musculoskeletal: Tenderness is noted on palpation of the thoracic spine however lower lumbar and to the left SI joint area there is moderate tenderness which is where patient has been experiencing her pain.  Range of motion is restricted secondary to increased pain.  Good muscle strength bilaterally.  Straight leg raises were approximately 30 degrees with discomfort. Neurologic:  Normal speech and language.  Reflexes 2+ bilaterally lower extremities.  No gross focal neurologic deficits are appreciated. No gait instability. Skin:  Skin is warm, dry and intact. No rash noted. Psychiatric: Mood and affect are normal. Speech and behavior are normal.  ____________________________________________   LABS (all labs ordered are listed, but only abnormal results are displayed)  Labs Reviewed  URINALYSIS, COMPLETE (UACMP) WITH MICROSCOPIC - Abnormal; Notable for the following components:      Result Value   Color, Urine YELLOW (*)    APPearance CLEAR (*)    Hgb urine dipstick MODERATE (*)    All other components within normal limits     RADIOLOGY I, 08/09/20, personally viewed and evaluated these images (plain radiographs) as part of my medical decision making, as well as reviewing the written report by the radiologist.   Official radiology report(s): DG Lumbar Spine 2-3 Views  Result Date: 08/07/2020 CLINICAL DATA:  Back pain with no known injury. Pain radiates down left leg. EXAM: LUMBAR SPINE - 2-3 VIEW COMPARISON:  None.  FINDINGS: Alignment within normal limits. There are 6 lumbar type vertebral bodies vertebral body heights maintained. Mild to moderate disc height loss seen at L1-L2 and L2-L3. Advanced facet degenerative changes seen throughout, greatest at L5-S1. IMPRESSION: Multilevel degenerative changes of the lumbar spine as above. Electronically Signed   By: 08/09/2020 M.D.   On: 08/07/2020 13:34    ____________________________________________   PROCEDURES  Procedure(s) performed (including Critical Care):  Procedures   ____________________________________________   INITIAL IMPRESSION / ASSESSMENT AND PLAN / ED COURSE  As part of my medical decision making, I reviewed the following data within the electronic MEDICAL RECORD NUMBER Notes from prior ED visits and [place] Controlled Substance Database  60 year old female presents to the ED with complaint of low back pain for several days without history of  injury.  She states that the pain moves into her left leg but does not radiate completely down to her toes.  She still continues to ambulate without any assistance.  She denies any history of back problems in the past.  Patient was given Toradol prior to going to get a lumbar x-ray and states that she is feeling improvement already.  X-ray showed multiple levels of degenerative changes and patient was made aware.  Is to follow-up with her primary care provider.  A prescription today was sent to her pharmacy for Tylenol 3 and methocarbamol 500 mg every 6 hours as needed for muscle spasms.  She is encouraged to use ice or heat to her back as needed for discomfort.  She is aware that with arthritis this can flareup from time to time and because she is on anticoagulant she should talk to her primary care provider before taking any anti-inflammatories.  ____________________________________________   FINAL CLINICAL IMPRESSION(S) / ED DIAGNOSES  Final diagnoses:  Osteoarthritis of spine with radiculopathy, lumbar  region     ED Discharge Orders         Ordered    methocarbamol (ROBAXIN) 500 MG tablet        08/07/20 1351    acetaminophen-codeine (TYLENOL #3) 300-30 MG tablet  Every 6 hours PRN        08/07/20 1351          *Please note:  JASMINA GENDRON was evaluated in Emergency Department on 08/07/2020 for the symptoms described in the history of present illness. She was evaluated in the context of the global COVID-19 pandemic, which necessitated consideration that the patient might be at risk for infection with the SARS-CoV-2 virus that causes COVID-19. Institutional protocols and algorithms that pertain to the evaluation of patients at risk for COVID-19 are in a state of rapid change based on information released by regulatory bodies including the CDC and federal and state organizations. These policies and algorithms were followed during the patient's care in the ED.  Some ED evaluations and interventions may be delayed as a result of limited staffing during and the pandemic.*   Note:  This document was prepared using Dragon voice recognition software and may include unintentional dictation errors.    Tommi Rumps, PA-C 08/07/20 1435    Minna Antis, MD 08/07/20 1943

## 2020-08-07 NOTE — ED Triage Notes (Signed)
Pt c/o left lower back pain radiating into the left leg for the past 2 days.

## 2020-08-07 NOTE — ED Notes (Signed)
See triage note  Presents with lower back pain for the past couple of days   Denies any recent injury  States pain is moving into left leg  Is able to stand  Increased apin with ambulation

## 2021-06-13 ENCOUNTER — Emergency Department
Admission: EM | Admit: 2021-06-13 | Discharge: 2021-06-13 | Disposition: A | Discharge status: Home / Self Care | Payer: Self-pay | Attending: Emergency Medicine | Admitting: Emergency Medicine

## 2021-06-13 ENCOUNTER — Other Ambulatory Visit: Payer: Self-pay

## 2021-06-13 ENCOUNTER — Emergency Department: Payer: Self-pay

## 2021-06-13 DIAGNOSIS — M1711 Unilateral primary osteoarthritis, right knee: Secondary | ICD-10-CM

## 2021-06-13 DIAGNOSIS — M25561 Pain in right knee: Secondary | ICD-10-CM

## 2021-06-13 DIAGNOSIS — M1712 Unilateral primary osteoarthritis, left knee: Secondary | ICD-10-CM | POA: Insufficient documentation

## 2021-06-13 MED ORDER — MELOXICAM 15 MG PO TABS: 15.0000 mg | ORAL_TABLET | Freq: Every day | ORAL | 0 refills | Status: AC

## 2021-06-13 MED ORDER — MELOXICAM 7.5 MG PO TABS
15.0000 mg | ORAL_TABLET | Freq: Once | ORAL | Status: AC
  Administered 2021-06-13: 15 mg via ORAL
  Filled 2021-06-13: qty 2

## 2021-06-13 NOTE — ED Notes (Signed)
See triage note.  ? ?Pt denies any injury or trauma to this area.  ?

## 2021-06-13 NOTE — ED Notes (Signed)
D/C, new RX, and f/up discussed with pt, pt verbalized understanding. NAD noted on D/C.  ?

## 2021-06-13 NOTE — ED Provider Notes (Signed)
? ?[facility] ?Provider Note ? ? Event Date/Time  ? First MD Initiated Contact with Patient 06/13/21 1333   ?  (approximate) ?History  ?Knee Pain ? ?HPI ?Jillian Vazquez is a 61 y.o. female with stated past medical history of anxiety/depression, GERD, and hypercholesterolemia who presents for right knee pain that is worsening over the last 2 weeks.  Patient describes 10/10, nonradiating anterior knee pain that is worse with walking.  Patient states that she had similar pain in the left knee within the past few years and was told that it was significant osteoarthritis however she has never followed up with orthopedic surgery.  Patient states that she is now having similar pain in the right knee despite no falls or trauma.  Denies any numbness/paresthesias distally.  Denies any color change over this extremity ?Physical Exam  ?Triage Vital Signs: ?ED Triage Vitals  ?Enc Vitals Group  ?   BP 06/13/21 1337 132/72  ?   Pulse Rate 06/13/21 1332 (!) 102  ?   Resp 06/13/21 1332 18  ?   Temp 06/13/21 1332 97.8 ?F (36.6 ?C)  ?   Temp Source 06/13/21 1332 Oral  ?   SpO2 06/13/21 1332 95 %  ?   Weight 06/13/21 1336 260 lb (117.9 kg)  ?   Height 06/13/21 1336 5\' 5"  (1.651 m)  ?   Head Circumference --   ?   Peak Flow --   ?   Pain Score 06/13/21 1336 10  ?   Pain Loc --   ?   Pain Edu? --   ?   Excl. in [place]? --   ? ?Most recent vital signs: ?Vitals:  ? 06/13/21 1332 06/13/21 1337  ?BP:  132/72  ?Pulse: (!) 102   ?Resp: 18   ?Temp: 97.8 ?F (36.6 ?C)   ?SpO2: 95%   ? ?General: Awake, oriented x4. ?CV:  Good peripheral perfusion.  ?Resp:  Normal effort.  ?Abd:  No distention.  ?Other:  Middle-aged morbidly obese Caucasian female laying in bed in no distress.  Tenderness over the joint space of the right knee as well as as mild pain with active range of motion.  Sensation and strength intact in bilateral lower extremities ?ED Results / Procedures / Treatments  ? ?RADIOLOGY ?ED MD interpretation: 4 view x-ray of  the right knee shows moderate to severe tricompartment osteoarthritis. ?-Agree with radiology assessment ?Official radiology report(s): ?DG Knee Complete 4 Views Right ? ?Result Date: 06/13/2021 ?CLINICAL DATA:  Anterior distal femur swelling and pain EXAM: RIGHT KNEE - COMPLETE 4+ VIEW COMPARISON:  None FINDINGS: Moderate to severe tricompartment osteoarthritis is identified. No joint effusion. No signs of acute fracture or dislocation. Soft tissues are unremarkable. 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 Electronically Signed   By: Kerby Moors M.D.   On: 06/13/2021 15:19   ?PROCEDURES: ?Critical Care performed: No ?Procedures ?MEDICATIONS ORDERED IN ED: ?Medications  ?meloxicam (MOBIC) tablet 15 mg (has no administration in time range)  ? ?IMPRESSION / MDM / ASSESSMENT AND PLAN / ED COURSE  ?I reviewed the triage vital signs and the nursing notes. ?             ?               ? ?61 year old female presents for right knee pain ?Given history, exam and workup I have low suspicion for fracture, dislocation, significant ligamentous injury, septic arthritis, gout flare, new autoimmune arthropathy, or gonococcal arthropathy. ? ?Interventions: ?  4 view x-ray of the right knee shows no evidence of acute abnormalities ?Disposition: Discharge home with strict return precautions and instructions for prompt primary care follow up in the next week. ? ?  ?FINAL CLINICAL IMPRESSION(S) / ED DIAGNOSES  ? ?Final diagnoses:  ?Acute pain of right knee  ?Tricompartment osteoarthritis of right knee  ? ?Rx / DC Orders  ? ?ED Discharge Orders   ? ?      Ordered  ?  meloxicam (MOBIC) 15 MG tablet  Daily       ? 06/13/21 1538  ? ?  ?  ? ?  ? ?Note:  This document was prepared using Dragon voice recognition software and may include unintentional dictation errors. ?  ?Naaman Plummer, MD ?06/13/21 1545 ? ?

## 2021-06-13 NOTE — ED Triage Notes (Signed)
Pt with c/o right knee pain that has been going on for almost 2 weeks. Pt denies injury. Pt with swelling to top of right knee. No redness or warmth noted. Pt states pain comes and goes.  ?

## 2021-07-18 ENCOUNTER — Observation Stay
Admission: EM | Admit: 2021-07-18 | Discharge: 2021-07-19 | Disposition: A | Discharge status: Home / Self Care | Payer: Self-pay | Attending: Internal Medicine | Admitting: Internal Medicine

## 2021-07-18 ENCOUNTER — Emergency Department: Payer: Self-pay

## 2021-07-18 ENCOUNTER — Other Ambulatory Visit: Payer: Self-pay

## 2021-07-18 DIAGNOSIS — R778 Other specified abnormalities of plasma proteins: Principal | ICD-10-CM | POA: Insufficient documentation

## 2021-07-18 DIAGNOSIS — Z7982 Long term (current) use of aspirin: Secondary | ICD-10-CM | POA: Insufficient documentation

## 2021-07-18 DIAGNOSIS — Z7901 Long term (current) use of anticoagulants: Secondary | ICD-10-CM | POA: Insufficient documentation

## 2021-07-18 DIAGNOSIS — Z8673 Personal history of transient ischemic attack (TIA), and cerebral infarction without residual deficits: Secondary | ICD-10-CM | POA: Insufficient documentation

## 2021-07-18 DIAGNOSIS — R051 Acute cough: Secondary | ICD-10-CM

## 2021-07-18 DIAGNOSIS — R42 Dizziness and giddiness: Secondary | ICD-10-CM

## 2021-07-18 DIAGNOSIS — Z20822 Contact with and (suspected) exposure to covid-19: Secondary | ICD-10-CM | POA: Insufficient documentation

## 2021-07-18 DIAGNOSIS — R202 Paresthesia of skin: Secondary | ICD-10-CM

## 2021-07-18 DIAGNOSIS — Z79899 Other long term (current) drug therapy: Secondary | ICD-10-CM | POA: Insufficient documentation

## 2021-07-18 DIAGNOSIS — R7989 Other specified abnormal findings of blood chemistry: Secondary | ICD-10-CM | POA: Diagnosis present

## 2021-07-18 DIAGNOSIS — Z86718 Personal history of other venous thrombosis and embolism: Secondary | ICD-10-CM | POA: Insufficient documentation

## 2021-07-18 DIAGNOSIS — I10 Essential (primary) hypertension: Secondary | ICD-10-CM | POA: Insufficient documentation

## 2021-07-18 LAB — BASIC METABOLIC PANEL
Anion gap: 8 (ref 5–15)
BUN: 21 mg/dL (ref 8–23)
CO2: 27 mmol/L (ref 22–32)
Calcium: 9.3 mg/dL (ref 8.9–10.3)
Chloride: 107 mmol/L (ref 98–111)
Creatinine, Ser: 0.64 mg/dL (ref 0.44–1.00)
GFR, Estimated: >60 mL/min (ref >60)
Glucose, Bld: 85 mg/dL (ref 70–99)
Potassium: 3.8 mmol/L (ref 3.5–5.1)
Sodium: 142 mmol/L (ref 135–145)

## 2021-07-18 LAB — CBC
HCT: 42.7 % (ref 36.0–46.0)
Hemoglobin: 13.6 g/dL (ref 12.0–15.0)
MCH: 28.2 pg (ref 26.0–34.0)
MCHC: 31.9 g/dL (ref 30.0–36.0)
MCV: 88.6 fL (ref 80.0–100.0)
Platelets: 397 10*3/uL (ref 150–400)
RBC: 4.82 MIL/uL (ref 3.87–5.11)
RDW: 14 % (ref 11.5–15.5)
WBC: 10.6 10*3/uL — ABNORMAL HIGH (ref 4.0–10.5)
nRBC: 0 % (ref 0.0–0.2)

## 2021-07-18 LAB — URINALYSIS, ROUTINE W REFLEX MICROSCOPIC
Bacteria, UA: NONE SEEN
Bilirubin Urine: NEGATIVE
Glucose, UA: NEGATIVE mg/dL
Ketones, ur: NEGATIVE mg/dL
Nitrite: NEGATIVE
Protein, ur: NEGATIVE mg/dL
Specific Gravity, Urine: 1.017 (ref 1.005–1.030)
pH: 7 (ref 5.0–8.0)

## 2021-07-18 LAB — URINE DRUG SCREEN, QUALITATIVE (ARMC ONLY)
Amphetamines, Ur Screen: NOT DETECTED
Barbiturates, Ur Screen: NOT DETECTED
Benzodiazepine, Ur Scrn: NOT DETECTED
Cannabinoid 50 Ng, Ur ~~LOC~~: NOT DETECTED
Cocaine Metabolite,Ur ~~LOC~~: NOT DETECTED
MDMA (Ecstasy)Ur Screen: NOT DETECTED
Methadone Scn, Ur: NOT DETECTED
Opiate, Ur Screen: NOT DETECTED
Phencyclidine (PCP) Ur S: NOT DETECTED
Tricyclic, Ur Screen: NOT DETECTED

## 2021-07-18 LAB — TROPONIN I (HIGH SENSITIVITY)
Troponin I (High Sensitivity): 64 ng/L — ABNORMAL HIGH (ref <18)
Troponin I (High Sensitivity): 67 ng/L — ABNORMAL HIGH (ref <18)
Troponin I (High Sensitivity): 69 ng/L — ABNORMAL HIGH (ref <18)
Troponin I (High Sensitivity): 71 ng/L — ABNORMAL HIGH (ref <18)

## 2021-07-18 LAB — PROTIME-INR
INR: 2.6 — ABNORMAL HIGH (ref 0.8–1.2)
Prothrombin Time: 27.2 seconds — ABNORMAL HIGH (ref 11.4–15.2)

## 2021-07-18 LAB — RESP PANEL BY RT-PCR (FLU A&B, COVID) ARPGX2
Influenza A by PCR: NEGATIVE
Influenza B by PCR: NEGATIVE
SARS Coronavirus 2 by RT PCR: NEGATIVE

## 2021-07-18 LAB — CBG MONITORING, ED: Glucose-Capillary: 92 mg/dL (ref 70–99)

## 2021-07-18 MED ORDER — SIMVASTATIN 20 MG PO TABS
40.0000 mg | ORAL_TABLET | Freq: Every day | ORAL | Status: DC
  Administered 2021-07-18: 40 mg via ORAL
  Filled 2021-07-18 (×2): qty 4

## 2021-07-18 MED ORDER — WARFARIN SODIUM 5 MG PO TABS
5.0000 mg | ORAL_TABLET | ORAL | Status: DC
  Administered 2021-07-18: 5 mg via ORAL
  Filled 2021-07-18 (×2): qty 1

## 2021-07-18 MED ORDER — DICLOFENAC SODIUM 1 % EX GEL
2.0000 g | Freq: Four times a day (QID) | CUTANEOUS | Status: DC | PRN
  Administered 2021-07-18: 2 g via TOPICAL
  Filled 2021-07-18: qty 100

## 2021-07-18 MED ORDER — WARFARIN SODIUM 2.5 MG PO TABS: 2.5000 mg | ORAL_TABLET | ORAL | Status: DC

## 2021-07-18 MED ORDER — ACETAMINOPHEN 650 MG RE SUPP: 650.0000 mg | Freq: Four times a day (QID) | RECTAL | Status: DC | PRN

## 2021-07-18 MED ORDER — WARFARIN - PHARMACIST DOSING INPATIENT
Freq: Every day | Status: DC
  Filled 2021-07-18: qty 1

## 2021-07-18 MED ORDER — HYDRALAZINE HCL 20 MG/ML IJ SOLN: 10.0000 mg | INTRAMUSCULAR | Status: DC | PRN

## 2021-07-18 MED ORDER — ALBUTEROL SULFATE (2.5 MG/3ML) 0.083% IN NEBU: 2.5000 mg | INHALATION_SOLUTION | RESPIRATORY_TRACT | Status: DC | PRN

## 2021-07-18 MED ORDER — ACETAMINOPHEN 325 MG PO TABS: 650.0000 mg | ORAL_TABLET | Freq: Four times a day (QID) | ORAL | Status: DC | PRN

## 2021-07-18 MED ORDER — ONDANSETRON HCL 4 MG/2ML IJ SOLN: 4.0000 mg | Freq: Four times a day (QID) | INTRAMUSCULAR | Status: DC | PRN

## 2021-07-18 MED ORDER — TRAZODONE HCL 50 MG PO TABS: 25.0000 mg | ORAL_TABLET | Freq: Every evening | ORAL | Status: DC | PRN

## 2021-07-18 MED ORDER — BUTALBITAL-APAP-CAFFEINE 50-325-40 MG PO TABS: 1.0000 | ORAL_TABLET | Freq: Four times a day (QID) | ORAL | Status: DC | PRN

## 2021-07-18 MED ORDER — LISINOPRIL 5 MG PO TABS
5.0000 mg | ORAL_TABLET | Freq: Once | ORAL | Status: AC
  Administered 2021-07-18: 5 mg via ORAL
  Filled 2021-07-18: qty 1

## 2021-07-18 MED ORDER — ALPRAZOLAM 0.25 MG PO TABS
0.2500 mg | ORAL_TABLET | Freq: Three times a day (TID) | ORAL | Status: DC | PRN
  Administered 2021-07-18: 0.25 mg via ORAL
  Filled 2021-07-18: qty 1

## 2021-07-18 MED ORDER — ASPIRIN 81 MG PO CHEW
324.0000 mg | CHEWABLE_TABLET | Freq: Once | ORAL | Status: AC
  Administered 2021-07-18: 324 mg via ORAL
  Filled 2021-07-18: qty 4

## 2021-07-18 MED ORDER — ONDANSETRON HCL 4 MG PO TABS: 4.0000 mg | ORAL_TABLET | Freq: Four times a day (QID) | ORAL | Status: DC | PRN

## 2021-07-18 MED ORDER — PANTOPRAZOLE SODIUM 40 MG PO TBEC
40.0000 mg | DELAYED_RELEASE_TABLET | Freq: Every day | ORAL | Status: DC
  Administered 2021-07-18: 40 mg via ORAL
  Filled 2021-07-18 (×2): qty 1

## 2021-07-18 NOTE — ED Triage Notes (Signed)
Pt arrives with c/o dizziness, tingling fingers, and fatigue. Per pt, she starts to feel like this when her BP is high. Pt takes lisinopril for BP. Per pt, she has been taking her BP meds everyday.  ?

## 2021-07-18 NOTE — ED Provider Notes (Signed)
? ?[facility] ?Provider Note ? ? ? Event Date/Time  ? First MD Initiated Contact with Patient 07/18/21 1258   ?  (approximate) ? ? ?History  ? ?Dizziness ? ? ?HPI ? ?Jillian Vazquez is a 61 y.o. 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  He also states he has had paresthesias in her fingers bilaterally and symmetrically over this time.  She notes her blood pressures have been very high.  She has not had any new headache, chest pain, shortness of breath, abdominal pain vomiting or urinary symptoms.  No focal extremity weakness.  No recent falls or injuries.  She states she has been taking her Coumadin.  Denies tobacco abuse EtOH use or illicit drug use.  She states she feels dizzy when she is at rest and also when she moves her head and is not sure if movement makes it worse or not.  No prior similar episodes.  ? ?  ?Past Medical History:  ?Diagnosis Date  ? Anxiety and depression   ? CVA (cerebral infarction)   ? DVT (deep venous thrombosis) (HCC)   ? W/ PE  ? GERD (gastroesophageal reflux disease)   ? Small bowel obstruction (HCC)   ? hx  ? Stroke [facility](HCC)   ? ? ? ?Physical Exam  ?Triage Vital Signs: ?ED Triage Vitals [07/18/21 1220]  ?Enc Vitals Group  ?   BP (!) 146/103  ?   Pulse Rate 97  ?   Resp 20  ?   Temp 98 ?F (36.7 ?C)  ?   Temp src   ?   SpO2 96 %  ?   Weight 260 lb (117.9 kg)  ?   Height 5\' 5"  (1.651 m)  ?   Head Circumference   ?   Peak Flow   ?   Pain Score 0  ?   Pain Loc   ?   Pain Edu?   ?   Excl. in GC?   ? ? ?Most recent vital signs: ?Vitals:  ? 07/18/21 1500 07/18/21 1530  ?BP: (!) 172/155 (!) 160/95  ?Pulse: 80 91  ?Resp: 15 (!) 25  ?Temp:    ?SpO2: 98% 98%  ? ? ?General: Awake, no distress.  ?CV:  Good peripheral perfusion.  2+ radial pulses. ?Resp:  Normal effort.  There are bilaterally. ?Abd:  No  distention.  Soft. ?Other:  Cranial nerves II through XII grossly intact.  No pronator drift.  No finger dysmetria.  Symmetric 5/5 strength of all extremities.  Sensation intact to light touch in all extremities.   ? ? ?ED Results / Procedures / Treatments  ?Labs ?(all labs ordered are listed, but only abnormal results are displayed) ?Labs Reviewed  ?CBC - Abnormal; Notable for the following components:  ?    Result Value  ? WBC 10.6 (*)   ? All other components within normal limits  ?URINALYSIS, ROUTINE W REFLEX MICROSCOPIC - Abnormal; Notable for the following components:  ? Color, Urine YELLOW (*)   ? APPearance CLEAR (*)   ? Hgb urine dipstick SMALL (*)   ? Leukocytes,Ua TRACE (*)   ? All other components within normal limits  ?PROTIME-INR - Abnormal; Notable for the following components:  ? Prothrombin Time 27.2 (*)   ? INR 2.6 (*)   ? All other components within normal  limits  ?TROPONIN I (HIGH SENSITIVITY) - Abnormal; Notable for the following components:  ? Troponin I (High Sensitivity) 64 (*)   ? All other components within normal limits  ?TROPONIN I (HIGH SENSITIVITY) - Abnormal; Notable for the following components:  ? Troponin I (High Sensitivity) 67 (*)   ? All other components within normal limits  ?RESP PANEL BY RT-PCR (FLU A&B, COVID) [id number]  ?BASIC METABOLIC PANEL  ?URINE DRUG SCREEN, QUALITATIVE ([facility] ONLY)  ?CBG MONITORING, ED  ?TROPONIN I (HIGH SENSITIVITY)  ? ? ? ?EKG ? ?ECG is remarkable for sinus rhythm with PACs, ventricular rate of 96, Q waves in inferior leads and anterior leads with nonspecific ST change in lead II, V2 without other clear evidence of acute ischemia or significant arrhythmia. ? ? ?RADIOLOGY ? ? ?CT head on my review without evidence of ischemia, hemorrhage, mass effect or edema.  Also reviewed radiology interpretation and agree their findings of some chronic inflammatory changes maxillary sinuses without any other acute intracranial process. ? ?Chest reviewed by myself  shows no focal consoidation, effusion, edema, pneumothorax or other clear acute thoracic process. I also reviewed radiology interpretation and agree with findings described. ? ? ? ?PROCEDURES: ? ?Critical Care performed: No ? ?.1-3 Lead EKG Interpretation ?Performed by: Gilles Chiquito, MD ?Authorized by: Gilles Chiquito, MD  ? ?  Interpretation: normal   ?  ECG rate assessment: normal   ?  Rhythm: sinus rhythm   ?  Ectopy: PAC   ?  Conduction: normal   ? ?The patient is on the cardiac monitor to evaluate for evidence of arrhythmia and/or significant heart rate changes. ? ? ?MEDICATIONS ORDERED IN ED: ?Medications  ?pantoprazole (PROTONIX) EC tablet 40 mg (40 mg Oral Patient Refused/Not Given 07/18/21 1557)  ?acetaminophen (TYLENOL) tablet 650 mg (has no administration in time range)  ?  Or  ?acetaminophen (TYLENOL) suppository 650 mg (has no administration in time range)  ?traZODone (DESYREL) tablet 25 mg (has no administration in time range)  ?ondansetron (ZOFRAN) tablet 4 mg (has no administration in time range)  ?  Or  ?ondansetron (ZOFRAN) injection 4 mg (has no administration in time range)  ?albuterol (PROVENTIL) (2.5 MG/3ML) 0.083% nebulizer solution 2.5 mg (has no administration in time range)  ?hydrALAZINE (APRESOLINE) injection 10 mg (has no administration in time range)  ?simvastatin (ZOCOR) tablet 40 mg (has no administration in time range)  ?ALPRAZolam Prudy Feeler) tablet 0.25 mg (0.25 mg Oral Given 07/18/21 1559)  ?Warfarin - Pharmacist Dosing Inpatient (has no administration in time range)  ?butalbital-acetaminophen-caffeine (FIORICET) 50-325-40 MG per tablet 1 tablet (has no administration in time range)  ?warfarin (COUMADIN) tablet 5 mg (has no administration in time range)  ?  And  ?warfarin (COUMADIN) tablet 2.5 mg (has no administration in time range)  ?aspirin chewable tablet 324 mg (324 mg Oral Given 07/18/21 1338)  ?lisinopril (ZESTRIL) tablet 5 mg (5 mg Oral Given 07/18/21 1557)  ? ? ? ?IMPRESSION /  MDM / ASSESSMENT AND PLAN / ED COURSE  ?I reviewed the triage vital signs and the nursing notes. ?             ?               ? ?Differential diagnosis includes, but is not limited to metabolic derangements, anemia, arrhythmia, pneumonia, CHF, ACS with a lower suspicion based on patient's history and exam for CVA, trauma or toxic ingestion.  She states she weaned herself off of Cymbalta 3 to  4 weeks ago, dropping this would be likely causing her symptoms today.  Her INR is therapeutic at 2.6 and I have low suspicion for PE. ? ?BMP shows no significant electrolyte or metabolic derangements.  CBC BBC count of 10.6 and normal hemoglobin and platelets.  UA has some small hemoglobin and trace leukocyte esterase but does not otherwise appear infected.  UDS is negative.  COVID influenza PCR is negative. ? ?ECG is remarkable for sinus rhythm with PACs, ventricular rate of 96, Q waves in inferior leads and anterior leads with nonspecific ST change in lead II, V2 without other clear evidence of acute ischemia or significant arrhythmia.  Opponent elevated but stable at 64 and 67.  She has not any chest pain but her EKG does have some new Q waves prior to ECG from 08/22/2015.  I am concerned for ACS likely exacerbated by: Controlled blood pressure. ? ?CT head on my review without evidence of ischemia, hemorrhage, mass effect or edema.  Also reviewed radiology interpretation and agree their findings of some chronic inflammatory changes maxillary sinuses without any other acute intracranial process. ? ?Chest reviewed by myself shows no focal consoidation, effusion, edema, pneumothorax or other clear acute thoracic process. I also reviewed radiology interpretation and agree with findings described. ? ?Admit to medicine for further evaluation management of patient's elevated troponins and high blood pressure.  Unclear etiology for for other symptoms at this time. ?  ? ? ?FINAL CLINICAL IMPRESSION(S) / ED DIAGNOSES  ? ?Final  diagnoses:  ?Dizziness  ?Elevated troponin  ?Acute cough  ?Paresthesia  ? ? ? ?Rx / DC Orders  ? ?ED Discharge Orders   ? ? None  ? ?  ? ? ? ?Note:  This document was prepared using Conservation officer, historic buildings

## 2021-07-18 NOTE — H&P (Signed)
?History and Physical  ? ? ?Jillian Vazquez XTK:[id number] DOB: 1961-01-13 DOA: 07/18/2021 ? ?PCP: Dwana Melena, PA  ?Patient coming from: Home ? ?I have personally briefly reviewed patient's old medical records in [facility] ? ?Chief Complaint: Dizziness, anxiety, headache ? ?HPI: Jillian Vazquez is a 61 y.o. female with medical history significant of hypertension, remote CVA, VTE, GERD, hiatal hernia presents for evaluation of 1 week of progressive dizziness associated with headache nausea.  No precipitating aggravating or alleviating factors.  Patient is a poor historian and her symptoms are somewhat nebulous. ? ?On arrival patient is hemodynamically stable.  EKG performed shows slight change from previous to that was in 2017.  Possible Q waves.  Troponin elevated to 64.  Unclear etiology.  Patient also endorsing headache.  Also endorses recent increase in anxiety.  Patient stopped her Celexa after 1-1/2 months.  Medication was stopped suddenly and not tapered. ? ?Given new changes on EKG and minimal elevation in troponin hospitalist contacted for admission. ? ?ED Course: Initial signs and symptoms concerning for possible CVA.  No neurologic signs or symptoms.  CT head negative.  EKG with Q waves as above.  Minimal troponin elevation.  Remainder of laboratory work-up unrevealing.  Patient did have some hypertension and endorsing headache.  Hospitalist contacted for admission. ? ?Review of Systems: As per HPI otherwise 14 point review of systems negative.  ? ? ?Past Medical History:  ?Diagnosis Date  ? Anxiety and depression   ? CVA (cerebral infarction)   ? DVT (deep venous thrombosis) (HCC)   ? W/ PE  ? GERD (gastroesophageal reflux disease)   ? Small bowel obstruction (HCC)   ? hx  ? Stroke [facility])   ? ? ?Past Surgical History:  ?Procedure Laterality Date  ? CARDIAC CATHETERIZATION  12/14  ? [facility]  ? ? ? reports that she has never smoked. She has never used smokeless tobacco. She reports that she does not drink  alcohol and does not use drugs. ? ?Allergies  ?Allergen Reactions  ? Percocet [Oxycodone-Acetaminophen]   ? Vicodin [Hydrocodone-Acetaminophen]   ? ? ?History reviewed. No pertinent family history. ?No known family history of coronary artery disease ? ?Prior to Admission medications   ?Medication Sig Start Date End Date Taking? Authorizing Provider  ?acetaminophen-codeine (TYLENOL #3) 300-30 MG tablet Take 1 tablet by mouth every 6 (six) hours as needed for moderate pain. 08/07/20   Tommi Rumps, PA-C  ?aspirin 81 MG tablet Take 81 mg by mouth daily.    [provider]  ?citalopram (CELEXA) 20 MG tablet Take 20 mg by mouth daily.    [provider]  ?clonazePAM (KLONOPIN) 0.5 MG tablet Take 0.5 mg by mouth 3 (three) times daily as needed for anxiety.    [provider]  ?iron polysaccharides (NIFEREX) 150 MG capsule Take 150 mg by mouth 2 (two) times daily.    [provider]  ?lidocaine (XYLOCAINE) 2 % solution Apply to cotton ball and apply to dental pain q 4 hours 10/08/14   Tommi Rumps, PA-C  ?lovastatin (MEVACOR) 40 MG tablet Take 40 mg by mouth at bedtime.    [provider]  ?methocarbamol (ROBAXIN) 500 MG tablet 1-2 tablets every 6 hours prn muscle spasms. 08/07/20   Tommi Rumps, PA-C  ?metoprolol tartrate (LOPRESSOR) 25 MG tablet Take 25 mg by mouth 2 (two) times daily.    [provider]  ?omeprazole (PRILOSEC) 20 MG capsule Take 20 mg by mouth  daily.    [provider]  ?vitamin B-12 (CYANOCOBALAMIN) 1000 MCG tablet Take 1,000 mcg by mouth daily.    [provider]  ?warfarin (COUMADIN) 5 MG tablet Take 5 mg by mouth daily.    [provider]  ? ? ?Physical Exam: ?Vitals:  ? 07/18/21 1330 07/18/21 1400 07/18/21 1500 07/18/21 1530  ?BP: (!) 144/63 (!) 152/137 (!) 172/155 (!) 160/95  ?Pulse: 85 92 80 91  ?Resp: 13 (!) 24 15 (!) 25  ?Temp:      ?SpO2: 99% 98% 98% 98%  ?Weight:      ?Height:      ? ? ?Vitals:  ?  07/18/21 1330 07/18/21 1400 07/18/21 1500 07/18/21 1530  ?BP: (!) 144/63 (!) 152/137 (!) 172/155 (!) 160/95  ?Pulse: 85 92 80 91  ?Resp: 13 (!) 24 15 (!) 25  ?Temp:      ?SpO2: 99% 98% 98% 98%  ?Weight:      ?Height:      ? ?Constitutional: No acute distress ?Respiratory: Lungs clear.  Normal work of breathing.  Room air ?Cardiovascular: S1-S2, regular rate and rhythm, no murmurs, no pedal edema ?Abdomen: Obese, soft, NT/ND, normal bowel sounds ?Musculoskeletal: No clubbing or cyanosis.  Sensation intact.  Good range of motion ?Skin: no rashes, lesions, ulcers. No induration ?Neurologic: Cranial nerves grossly intact.  Sensation intact.  Gait not assessed ?Psychiatric: Normal judgment and insight. Alert and oriented x 3. Normal mood.  ? ? ?Labs on Admission: I have personally reviewed following labs and imaging studies ? ?CBC: ?Recent Labs  ?Lab 07/18/21 ?1222  ?WBC 10.6*  ?HGB 13.6  ?HCT 42.7  ?MCV 88.6  ?PLT 397  ? ?Basic Metabolic Panel: ?Recent Labs  ?Lab 07/18/21 ?1222  ?NA 142  ?K 3.8  ?CL 107  ?CO2 27  ?GLUCOSE 85  ?BUN 21  ?CREATININE 0.64  ?CALCIUM 9.3  ? ?GFR: ?Estimated Creatinine Clearance: 92.5 mL/min (by C-G formula based on SCr of 0.64 mg/dL). ?Liver Function Tests: ?No results for input(s): AST, ALT, ALKPHOS, BILITOT, PROT, ALBUMIN in the last 168 hours. ?No results for input(s): LIPASE, AMYLASE in the last 168 hours. ?No results for input(s): AMMONIA in the last 168 hours. ?Coagulation Profile: ?Recent Labs  ?Lab 07/18/21 ?1456  ?INR 2.6*  ? ?Cardiac Enzymes: ?No results for input(s): CKTOTAL, CKMB, CKMBINDEX, TROPONINI in the last 168 hours. ?BNP (last 3 results) ?No results for input(s): PROBNP in the last 8760 hours. ?HbA1C: ?No results for input(s): HGBA1C in the last 72 hours. ?CBG: ?Recent Labs  ?Lab 07/18/21 ?1225  ?GLUCAP 92  ? ?Lipid Profile: ?No results for input(s): CHOL, HDL, LDLCALC, TRIG, CHOLHDL, LDLDIRECT in the last 72 hours. ?Thyroid Function Tests: ?No results for input(s): TSH,  T4TOTAL, FREET4, T3FREE, THYROIDAB in the last 72 hours. ?Anemia Panel: ?No results for input(s): VITAMINB12, FOLATE, FERRITIN, TIBC, IRON, RETICCTPCT in the last 72 hours. ?Urine analysis: ?   ?Component Value Date/Time  ? COLORURINE YELLOW (A) 07/18/2021 1222  ? APPEARANCEUR CLEAR (A) 07/18/2021 1222  ? APPEARANCEUR Hazy 07/27/2011 0931  ? LABSPEC 1.017 07/18/2021 1222  ? LABSPEC 1.023 07/27/2011 0931  ? PHURINE 7.0 07/18/2021 1222  ? GLUCOSEU NEGATIVE 07/18/2021 1222  ? GLUCOSEU Negative 07/27/2011 0931  ? HGBUR SMALL (A) 07/18/2021 1222  ? BILIRUBINUR NEGATIVE 07/18/2021 1222  ? BILIRUBINUR Negative 07/27/2011 0931  ? KETONESUR NEGATIVE 07/18/2021 1222  ? PROTEINUR NEGATIVE 07/18/2021 1222  ? NITRITE NEGATIVE 07/18/2021 1222  ? LEUKOCYTESUR TRACE (A) 07/18/2021 1222  ?  LEUKOCYTESUR Trace 07/27/2011 0931  ? ? ?Radiological Exams on Admission: ?DG Chest 2 View ? ?Result Date: 07/18/2021 ?CLINICAL DATA:  Cough. EXAM: CHEST - 2 VIEW COMPARISON:  August 21, 2015 FINDINGS: The heart size and mediastinal contours are within normal limits. Both lungs are clear. The visualized skeletal structures are unremarkable. IMPRESSION: No active cardiopulmonary disease. Electronically Signed   By: Gerome Samavid  Williams III M.D.   On: 07/18/2021 14:09  ? ?CT HEAD WO CONTRAST (5MM) ? ?Result Date: 07/18/2021 ?CLINICAL DATA:  Possible acute stroke. Dizziness with tingling in fingers and fatigue. EXAM: CT HEAD WITHOUT CONTRAST TECHNIQUE: Contiguous axial images were obtained from the base of the skull through the vertex without intravenous contrast. RADIATION DOSE REDUCTION: This exam was performed according to the departmental dose-optimization program which includes automated exposure control, adjustment of the mA and/or kV according to patient size and/or use of iterative reconstruction technique. COMPARISON:  07/22/2006 FINDINGS: Brain: Ventricles, cisterns and other CSF spaces are normal. There is no mass, mass effect, shift of midline  structures or acute hemorrhage. No evidence of acute infarction. Vascular: No hyperdense vessel or unexpected calcification. Skull: Normal. Negative for fracture or focal lesion. Sinuses/Orbits: Orbits are norm

## 2021-07-18 NOTE — Consult Note (Signed)
ANTICOAGULATION CONSULT NOTE - Initial Consult ? ?Pharmacy Consult for Warfarin ?Indication: DVT ? ?Allergies  ?Allergen Reactions  ? Percocet [Oxycodone-Acetaminophen]   ? Vicodin [Hydrocodone-Acetaminophen]   ? ? ?Patient Measurements: ?Height: 5\' 5"  (165.1 cm) ?Weight: 117.9 kg (260 lb) ?IBW/kg (Calculated) : 57 ? ?Vital Signs: ?Temp: 98 ?F (36.7 ?C) (05/07 1220) ?BP: 160/95 (05/07 1530) ?Pulse Rate: 91 (05/07 1530) ? ?Labs: ?Recent Labs  ?  07/18/21 ?1222 07/18/21 ?1431 07/18/21 ?1456  ?HGB 13.6  --   --   ?HCT 42.7  --   --   ?PLT 397  --   --   ?LABPROT  --   --  27.2*  ?INR  --   --  2.6*  ?CREATININE 0.64  --   --   ?TROPONINIHS 64* 67*  --   ? ? ?Estimated Creatinine Clearance: 92.5 mL/min (by C-G formula based on SCr of 0.64 mg/dL). ? ? ?Medical History: ?Past Medical History:  ?Diagnosis Date  ? Anxiety and depression   ? CVA (cerebral infarction)   ? DVT (deep venous thrombosis) (HCC)   ? W/ PE  ? GERD (gastroesophageal reflux disease)   ? Small bowel obstruction (HCC)   ? hx  ? Stroke [facility])   ? ? ?Medications:  ?Scheduled:  ? lisinopril  5 mg Oral Once  ? pantoprazole  40 mg Oral Daily  ? simvastatin  40 mg Oral q1800  ? Warfarin - Pharmacist Dosing Inpatient   Does not apply q1600  ? ? ?Assessment: ?Patient admitted to ED with dizziness. PMH includes CVA, GERD, and DVT. Taking warfarin PTA. Pharmacy consulted to manage warfarin while inpatient. ? ?PTA Warfarin 2.5mg  every Thursday, 5mg  all other days for the week. ? ?INR 2.6 on admission, Plt and hgb WNL ? ?Goal of Therapy:  ?INR 2-3 ?Monitor platelets by anticoagulation protocol: Yes ?  ?Plan:  ?Continue PTA warfarin dose ?Daily INR x 3 days ?Monitor CBC at [facility] every 3 days while inpatient ? ?Keelan Pomerleau Rodriguez-Guzman PharmD, BCPS ?07/18/2021 3:55 PM ? ? ?

## 2021-07-19 LAB — PROTIME-INR
INR: 2.6 — ABNORMAL HIGH (ref 0.8–1.2)
Prothrombin Time: 27.9 seconds — ABNORMAL HIGH (ref 11.4–15.2)

## 2021-07-19 LAB — TROPONIN I (HIGH SENSITIVITY): Troponin I (High Sensitivity): 67 ng/L — ABNORMAL HIGH (ref <18)

## 2021-07-19 MED ORDER — ALPRAZOLAM 0.25 MG PO TABS
0.2500 mg | ORAL_TABLET | Freq: Two times a day (BID) | ORAL | 0 refills | Status: AC | PRN
Start: 2021-07-19 — End: ?

## 2021-07-19 NOTE — TOC Initial Note (Signed)
Transition of Care (TOC) - Initial/Assessment Note  ? ? ?Patient Details  ?Name: Jillian Vazquez ?MRN: [id number] ?Date of Birth: 1960/12/15 ? ?Transition of Care (TOC) CM/SW Contact:    ?Truddie Hidden, RN ?Phone Number: ?07/19/2021, 10:27 AM ? ?Clinical Narrative:                 ? ? ?Transition of Care (TOC) Screening Note ? ? ?Patient Details  ?Name: Jillian Vazquez ?Date of Birth: [birth year] ? ? ?Transition of Care (TOC) CM/SW Contact:    ?Truddie Hidden, RN ?Phone Number: ?07/19/2021, 10:27 AM ? ? ? ?Transition of Care Department [facility]) has reviewed patient and no TOC needs have been identified at this time. We will continue to monitor patient advancement through interdisciplinary progression rounds. If new patient transition needs arise, please place a TOC consult. ?  ?  ?  ? ? ?Patient Goals and CMS Choice ?  ?  ?  ? ?Expected Discharge Plan and Services ?  ?  ?  ?  ?  ?Expected Discharge Date: 07/19/21               ?  ?  ?  ?  ?  ?  ?  ?  ?  ?  ? ?Prior Living Arrangements/Services ?  ?  ?  ?       ?  ?  ?  ?  ? ?Activities of Daily Living ?Home Assistive Devices/Equipment: Dan Humphreys (specify type) ?ADL Screening (condition at time of admission) ?Patient's cognitive ability adequate to safely complete daily activities?: Yes ?Is the patient deaf or have difficulty hearing?: No ?Does the patient have difficulty seeing, even when wearing glasses/contacts?: No ?Does the patient have difficulty concentrating, remembering, or making decisions?: No ?Patient able to express need for assistance with ADLs?: Yes ?Does the patient have difficulty dressing or bathing?: No ?Independently performs ADLs?: Yes (appropriate for developmental age) ?Does the patient have difficulty walking or climbing stairs?: No ?Weakness of Legs: None ?Weakness of Arms/Hands: None ? ?Permission Sought/Granted ?  ?  ?   ?   ?   ?   ? ?Emotional Assessment ?  ?  ?  ?  ?  ?  ? ?Admission diagnosis:  Paresthesia [R20.2] ?Dizziness [R42] ?Elevated troponin  [R77.8] ?Acute cough [R05.1] ?Patient Active Problem List  ? Diagnosis Date Noted  ? Elevated troponin 07/18/2021  ? ?PCP:  Dwana Melena, PA ?Pharmacy:   ?[id number] - [address] [place] - [address] ?[address] ?[place] [address] ?Phone: [phone] Fax: [phone] ? ? ? ? ?Social Determinants of Health (SDOH) Interventions ?  ? ?Readmission Risk Interventions ?   ? View : No data to display.  ?  ?  ?  ? ? ? ?

## 2021-07-19 NOTE — Discharge Summary (Signed)
Physician Discharge Summary  ?Montez MoritaSylvia M Shuster NWG:[id number] DOB: 09/21/1960 DOA: 07/18/2021 ? ?PCP: Dwana MelenaScott, Russell B, PA ? ?Admit date: 07/18/2021 ?Discharge date: 07/19/2021 ? ?Admitted From: Home ?Disposition: Home ? ?Recommendations for Outpatient Follow-up:  ?Follow up with PCP in 1-2 weeks ?Consider referral to cardiology for outpatient stress testing ? ?Home Health: No ?Equipment/Devices: None ? ?Discharge Condition: Stable ?CODE STATUS: Full ?Diet recommendation: Heart healthy ? ?Brief/Interim Summary: ? 61 y.o. female with medical history significant of hypertension, remote CVA, VTE, GERD, hiatal hernia presents for evaluation of 1 week of progressive dizziness associated with headache nausea.  No precipitating aggravating or alleviating factors.  Patient is a poor historian and her symptoms are somewhat nebulous. ?  ?On arrival patient is hemodynamically stable.  EKG performed shows slight change from previous to that was in 2017.  Possible Q waves.  Troponin elevated to 64.  Unclear etiology.  Patient also endorsing headache.  Also endorses recent increase in anxiety.  Patient stopped her Celexa after 1-1/2 months.  Medication was stopped suddenly and not tapered. ?  ?Given new changes on EKG and minimal elevation in troponin hospitalist contacted for admission. ? ?Patient remains chest pain-free and stable to the course of the night.  No changes on telemetry.  Troponin remained very minimally low to mid 60s with no significant delta.  No chest pain on day of discharge.  Had a lengthy conversation patient.  Patient endorses recent life stressors and suddenly coming off of her SSRI.  This is likely underlying some of her complaints.  She also endorses progressive fatigue. ? ?[facility]  I strongly recommended that she follow-up with her primary care within 1 to 2 weeks of discharge for additional lab work, further conversation regarding her progressive fatigue.  As needed Xanax  prescribed for worsening anxiety.  Recommend the patient talk to her primary care physician about restarting her on an SSRI or potentially taking an alternative agent.  Given the minimal troponin elevation she may also benefit from referral to cardiology for outpatient stress testing. ? ? ? ?Discharge Diagnoses:  ?Principal Problem: ?  Elevated troponin ? ?Elevated troponin ?Unclear etiology.  Patient does have some Q waves on EKG ?No overt chest pain on my evaluation ?Differential broad including but not limited to ACS, PE, GERD, anxiety ?Troponin remains minimally elevated without significant delta ?No chest pain, no ischemic changes noted on telemetry ?Plan: ?We will discharge home at this time.  Defer echocardiogram for now as patient is not having any active chest pain or other signs or symptoms of decompensated failure or ACS.  [facility]  I recommended she follow-up with the PCP for further discussion regarding some of her issues and complaints.  Consider referral to outpatient cardiology for stress testing ? ?Depression ?Anxiety ?Patient endorses stopping her Celexa suddenly proximately week ago ?She was on it for 1-1/2 months ?Suspect there may be a element of SSRI withdrawal precipitating her symptoms ?She is endorsing anxiety ?Plan: ?At time of discharge will write prescription for as needed Xanax.  Patient is recommended to see her primary care physician and discuss sudden discontinuation of her Celexa and potential consideration for restarting an SSRI ? ?Discharge Instructions ? ?Discharge Instructions   ? ? Diet - low sodium heart healthy   Complete by: As directed ?  ? Increase activity slowly   Complete by: As directed ?  ? ?  ? ?Allergies as of 07/19/2021   ? ?  Reactions  ? Percocet [oxycodone-acetaminophen]   ? Vicodin [hydrocodone-acetaminophen]   ? ?  ? ?  ?Medication List  ?  ? ?STOP taking these medications   ? ?clonazePAM 0.5 MG tablet ?Commonly known as:  KLONOPIN ?  ? ?  ? ?TAKE these medications   ? ?acetaminophen-codeine 300-30 MG tablet ?Commonly known as: TYLENOL #3 ?Take 1 tablet by mouth every 6 (six) hours as needed for moderate pain. ?  ?ALPRAZolam 0.25 MG tablet ?Commonly known as: Prudy Feeler ?Take 1 tablet (0.25 mg total) by mouth 2 (two) times daily as needed for anxiety. ?  ?aspirin 81 MG tablet ?Take 81 mg by mouth daily. ?  ?citalopram 20 MG tablet ?Commonly known as: CELEXA ?Take 20 mg by mouth daily. ?  ?iron polysaccharides 150 MG capsule ?Commonly known as: NIFEREX ?Take 150 mg by mouth 2 (two) times daily. ?  ?lidocaine 2 % solution ?Commonly known as: XYLOCAINE ?Apply to cotton ball and apply to dental pain q 4 hours ?  ?lisinopril 5 MG tablet ?Commonly known as: ZESTRIL ?Take 5 mg by mouth daily. ?  ?lovastatin 40 MG tablet ?Commonly known as: MEVACOR ?Take 40 mg by mouth at bedtime. ?  ?methocarbamol 500 MG tablet ?Commonly known as: Robaxin ?1-2 tablets every 6 hours prn muscle spasms. ?  ?metoprolol tartrate 25 MG tablet ?Commonly known as: LOPRESSOR ?Take 25 mg by mouth 2 (two) times daily. ?  ?omeprazole 20 MG capsule ?Commonly known as: PRILOSEC ?Take 20 mg by mouth daily. ?  ?vitamin B-12 1000 MCG tablet ?Commonly known as: CYANOCOBALAMIN ?Take 1,000 mcg by mouth daily. ?  ?warfarin 5 MG tablet ?Commonly known as: COUMADIN ?Take 5 mg by mouth daily. ?  ? ?  ? ? ?Allergies  ?Allergen Reactions  ? Percocet [Oxycodone-Acetaminophen]   ? Vicodin [Hydrocodone-Acetaminophen]   ? ? ?Consultations: ?None ? ? ?Procedures/Studies: ?DG Chest 2 View ? ?Result Date: 07/18/2021 ?CLINICAL DATA:  Cough. EXAM: CHEST - 2 VIEW COMPARISON:  August 21, 2015 FINDINGS: The heart size and mediastinal contours are within normal limits. Both lungs are clear. The visualized skeletal structures are unremarkable. IMPRESSION: No active cardiopulmonary disease. Electronically Signed   By: Gerome Sam III M.D.   On: 07/18/2021 14:09  ? ?CT HEAD WO CONTRAST ( ) ? ?Result  Date: 07/18/2021 ?CLINICAL DATA:  Possible acute stroke. Dizziness with tingling in fingers and fatigue. EXAM: CT HEAD WITHOUT CONTRAST TECHNIQUE: Contiguous axial images were obtained from the base of the skull through the vertex without intravenous contrast. RADIATION DOSE REDUCTION: This exam was performed according to the departmental dose-optimization program which includes automated exposure control, adjustment of the mA and/or kV according to patient size and/or use of iterative reconstruction technique. COMPARISON:  07/22/2006 FINDINGS: Brain: Ventricles, cisterns and other CSF spaces are normal. There is no mass, mass effect, shift of midline structures or acute hemorrhage. No evidence of acute infarction. Vascular: No hyperdense vessel or unexpected calcification. Skull: Normal. Negative for fracture or focal lesion. Sinuses/Orbits: Orbits are normal. Opacification over the maxillary sinuses compatible chronic inflammatory change. Other: None. IMPRESSION: 1. No acute findings. 2. 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 Electronically Signed   By: Elberta Fortis M.D.   On: 07/18/2021 14:00   ? ? ? ?Subjective: ?Seen and examined the day of discharge.  Stable no distress.  Chest pain-free.  Stable for discharge home. ? ?Discharge Exam: ?Vitals:  ? 07/19/21 0413 07/19/21 0800  ?BP: 127/60 (!) 161/85  ?Pulse: 77 87  ?Resp: 18  16  ?Temp: 98.1 ?F (36.7 ?C) 98.4 ?F (36.9 ?C)  ?SpO2: 94% 97%  ? ?Vitals:  ? 07/18/21 2324 07/19/21 0412 07/19/21 0413 07/19/21 0800  ?BP: (!) 152/75  127/60 (!) 161/85  ?Pulse: 73  77 87  ?Resp: 16 18 18 16   ?Temp: (!) 97 ?F (36.1 ?C)  98.1 ?F (36.7 ?C) 98.4 ?F (36.9 ?C)  ?TempSrc: Oral  Oral   ?SpO2: 97%  94% 97%  ?Weight:      ?Height:      ? ? ?General: Pt is alert, awake, not in acute distress ?Cardiovascular: RRR, S1/S2 +, no rubs, no gallops ?Respiratory: CTA bilaterally, no wheezing, no rhonchi ?Abdominal: Soft, NT, ND, bowel sounds + ?Extremities: no edema, no  cyanosis ? ? ? ?The results of significant diagnostics from this hospitalization (including imaging, microbiology, ancillary and laboratory) are listed below for reference.   ? ? ?Microbiology: ?Recent Results (from th

## 2022-03-30 ENCOUNTER — Emergency Department
Admission: EM | Admit: 2022-03-30 | Discharge: 2022-03-30 | Disposition: A | Discharge status: Home / Self Care | Payer: Self-pay | Attending: Emergency Medicine | Admitting: Emergency Medicine

## 2022-03-30 ENCOUNTER — Encounter: Payer: Self-pay | Admitting: Emergency Medicine

## 2022-03-30 ENCOUNTER — Emergency Department: Payer: Self-pay

## 2022-03-30 ENCOUNTER — Other Ambulatory Visit: Payer: Self-pay

## 2022-03-30 DIAGNOSIS — Z7901 Long term (current) use of anticoagulants: Secondary | ICD-10-CM | POA: Insufficient documentation

## 2022-03-30 DIAGNOSIS — M25562 Pain in left knee: Secondary | ICD-10-CM

## 2022-03-30 DIAGNOSIS — Z86718 Personal history of other venous thrombosis and embolism: Secondary | ICD-10-CM | POA: Insufficient documentation

## 2022-03-30 DIAGNOSIS — M1712 Unilateral primary osteoarthritis, left knee: Secondary | ICD-10-CM | POA: Insufficient documentation

## 2022-03-30 MED ORDER — PREDNISONE 20 MG PO TABS: 40.0000 mg | ORAL_TABLET | Freq: Every day | ORAL | 0 refills | Status: AC

## 2022-03-30 MED ORDER — TRAMADOL HCL 50 MG PO TABS: 50.0000 mg | ORAL_TABLET | Freq: Two times a day (BID) | ORAL | 0 refills | Status: AC

## 2022-03-30 MED ORDER — TRAMADOL HCL 50 MG PO TABS
100.0000 mg | ORAL_TABLET | Freq: Once | ORAL | Status: AC
  Administered 2022-03-30: 100 mg via ORAL
  Filled 2022-03-30: qty 2

## 2022-03-30 MED ORDER — CYCLOBENZAPRINE HCL 5 MG PO TABS: 5.0000 mg | ORAL_TABLET | Freq: Three times a day (TID) | ORAL | 0 refills | Status: AC | PRN

## 2022-03-30 NOTE — Discharge Instructions (Addendum)
Your exam and XR shows severe arthritis. Take the prescription meds as directed. Follow-up with your PCP or Ortho for ongoing care.

## 2022-03-30 NOTE — ED Notes (Signed)
Pt given walker and shown how to use it properly

## 2022-03-30 NOTE — ED Notes (Signed)
See triage not. Pt in NAD talking on phone at this time. Pt has L knee pain and states it may have fluid on it.

## 2022-03-30 NOTE — ED Provider Notes (Signed)
[facility] Emergency Department Provider Note     Event Date/Time   First MD Initiated Contact with Patient 03/30/22 1808     (approximate)   History   Knee Pain   HPI  Jillian Vazquez is a 62 y.o. 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  She notes pain is increased with changes in the weather.  She denies any recent injury, trauma, fall.   Physical Exam   Triage Vital Signs: ED Triage Vitals  Enc Vitals Group     BP 03/30/22 1618 (!) 145/86     Pulse Rate 03/30/22 1618 99     Resp 03/30/22 1618 18     Temp 03/30/22 1618 98.4 F (36.9 C)     Temp Source 03/30/22 1618 Oral     SpO2 03/30/22 1618 96 %     Weight --      Height --      Head Circumference --      Peak Flow --      Pain Score 03/30/22 1616 9     Pain Loc --      Pain Edu? --      Excl. in [place]? --     Most recent vital signs: Vitals:   03/30/22 1618  BP: (!) 145/86  Pulse: 99  Resp: 18  Temp: 98.4 F (36.9 C)  SpO2: 96%    General Awake, no distress. NAD CV:  Good peripheral perfusion.  RESP:  Normal effort.  ABD:  No distention.  MSK:  Left knee without obvious deformity, dislocation, or joint effusion.  Patient with bony changes consistent with likely underlying DJD.  Normal flexion and extension range on exam.  No significant valgus or varus joint stresses elicited.  No popliteal space fullness is noted.  Nontender to palpation along the calf and Achilles.   ED Results / Procedures / Treatments   Labs (all labs ordered are listed, but only abnormal results are displayed) Labs Reviewed - No data to display   EKG    RADIOLOGY  I personally viewed and evaluated these images as part of my medical decision making, as well as reviewing the written report by the radiologist.  ED Provider Interpretation: no acute  findings  DG Knee Complete 4 Views Left  Result Date: 03/30/2022 CLINICAL DATA:  Left knee pain. EXAM: LEFT KNEE - COMPLETE 4+ VIEW COMPARISON:  Left knee radiographs 12/11/2014 FINDINGS: Severe intercarpal joint space narrowing with bone-on-bone contact, worsened from prior. Moderate to large peripheral medial compartment and moderate peripheral lateral compartment degenerative osteophytes. Large superior and mild inferior patellar degenerative osteophytosis. Small joint effusion. No acute fracture is seen. No dislocation. Moderate to severe proximal tibiofibular joint space narrowing and peripheral osteophytosis. Mild chronic enthesopathic change at the quadriceps insertion on the patella. IMPRESSION: 1. Severe medial and patellofemoral osteoarthritis, mildly worsened from prior. 2. 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 Electronically Signed   By: Yvonne Kendall M.D.   On: 03/30/2022 17:42     PROCEDURES:  Critical Care performed: No  Procedures   MEDICATIONS ORDERED IN ED: Medications  traMADol (ULTRAM) tablet 100 mg (100 mg Oral Given 03/30/22 1924)     IMPRESSION / MDM / ASSESSMENT AND PLAN / ED COURSE  I reviewed the triage vital signs and the nursing notes.  Differential diagnosis includes, but is not limited to, effusion, DJD, knee sprain  Patient's presentation is most consistent with acute complicated illness / injury requiring diagnostic workup.  Patient's diagnosis is consistent with acute on chronic knee pain secondary to underlying DJD. Patient will be discharged home with prescriptions for benzopyrene, prednisone, and tramadol. Patient is to follow up with the orthopedics as needed or otherwise directed. Patient is given ED precautions to return to the ED for any worsening or new symptoms.     FINAL CLINICAL IMPRESSION(S) / ED DIAGNOSES   Final diagnoses:  Acute pain of left knee  Primary osteoarthritis of left knee      Rx / DC Orders   ED Discharge Orders          Ordered    cyclobenzaprine (FLEXERIL) 5 MG tablet  3 times daily PRN        03/30/22 1853    predniSONE (DELTASONE) 20 MG tablet  Daily with breakfast        03/30/22 1853    traMADol (ULTRAM) 50 MG tablet  2 times daily        03/30/22 1853             Note:  This document was prepared using Dragon voice recognition software and may include unintentional dictation errors.    Melvenia Needles, PA-C 03/30/22 2324    Vanessa Waves, MD 03/31/22 [phone]

## 2022-03-30 NOTE — ED Triage Notes (Signed)
Patient to ED via POV for left knee pain. Patient states pain has been ongoing for the past 2 weeks. Patient states "I think I have fluid on it or arthritis." Pain is worse when raining or cold. Painful to walk on it. No known injury.

## 2022-09-20 ENCOUNTER — Other Ambulatory Visit: Payer: Self-pay

## 2022-09-20 ENCOUNTER — Emergency Department
Admission: EM | Admit: 2022-09-20 | Discharge: 2022-09-20 | Disposition: A | Discharge status: Home / Self Care | Payer: Self-pay | Attending: Emergency Medicine | Admitting: Emergency Medicine

## 2022-09-20 ENCOUNTER — Emergency Department: Payer: Self-pay

## 2022-09-20 ENCOUNTER — Encounter: Payer: Self-pay | Admitting: Emergency Medicine

## 2022-09-20 DIAGNOSIS — D72829 Elevated white blood cell count, unspecified: Secondary | ICD-10-CM | POA: Insufficient documentation

## 2022-09-20 DIAGNOSIS — Z7901 Long term (current) use of anticoagulants: Secondary | ICD-10-CM | POA: Insufficient documentation

## 2022-09-20 DIAGNOSIS — K439 Ventral hernia without obstruction or gangrene: Secondary | ICD-10-CM | POA: Insufficient documentation

## 2022-09-20 DIAGNOSIS — R1084 Generalized abdominal pain: Secondary | ICD-10-CM

## 2022-09-20 DIAGNOSIS — Z8673 Personal history of transient ischemic attack (TIA), and cerebral infarction without residual deficits: Secondary | ICD-10-CM | POA: Insufficient documentation

## 2022-09-20 LAB — CBC
HCT: 44.8 % (ref 36.0–46.0)
Hemoglobin: 14.5 g/dL (ref 12.0–15.0)
MCH: 28.9 pg (ref 26.0–34.0)
MCHC: 32.4 g/dL (ref 30.0–36.0)
MCV: 89.2 fL (ref 80.0–100.0)
Platelets: 448 10*3/uL — ABNORMAL HIGH (ref 150–400)
RBC: 5.02 MIL/uL (ref 3.87–5.11)
RDW: 13.8 % (ref 11.5–15.5)
WBC: 13.6 10*3/uL — ABNORMAL HIGH (ref 4.0–10.5)
nRBC: 0 % (ref 0.0–0.2)

## 2022-09-20 LAB — URINALYSIS, MICROSCOPIC (REFLEX)

## 2022-09-20 LAB — URINALYSIS, ROUTINE W REFLEX MICROSCOPIC
Bilirubin Urine: NEGATIVE
Glucose, UA: NEGATIVE mg/dL
Ketones, ur: NEGATIVE mg/dL
Nitrite: NEGATIVE
Protein, ur: NEGATIVE mg/dL
Specific Gravity, Urine: 1.019 (ref 1.005–1.030)
pH: 5 (ref 5.0–8.0)

## 2022-09-20 LAB — LIPASE, BLOOD: Lipase: 34 U/L (ref 11–51)

## 2022-09-20 LAB — COMPREHENSIVE METABOLIC PANEL
ALT: 19 U/L (ref 0–44)
AST: 26 U/L (ref 15–41)
Albumin: 3.9 g/dL (ref 3.5–5.0)
Alkaline Phosphatase: 57 U/L (ref 38–126)
Anion gap: 9 (ref 5–15)
BUN: 17 mg/dL (ref 8–23)
CO2: 25 mmol/L (ref 22–32)
Calcium: 9.3 mg/dL (ref 8.9–10.3)
Chloride: 103 mmol/L (ref 98–111)
Creatinine, Ser: 0.75 mg/dL (ref 0.44–1.00)
GFR, Estimated: >60 mL/min (ref >60)
Glucose, Bld: 88 mg/dL (ref 70–99)
Potassium: 4 mmol/L (ref 3.5–5.1)
Sodium: 137 mmol/L (ref 135–145)
Total Bilirubin: 0.7 mg/dL (ref 0.3–1.2)
Total Protein: 7.6 g/dL (ref 6.5–8.1)

## 2022-09-20 LAB — PROTIME-INR
INR: 3 — ABNORMAL HIGH (ref 0.8–1.2)
Prothrombin Time: 31.6 seconds — ABNORMAL HIGH (ref 11.4–15.2)

## 2022-09-20 MED ORDER — LACTATED RINGERS IV BOLUS
1000.0000 mL | Freq: Once | INTRAVENOUS | Status: AC
  Administered 2022-09-20: 1000 mL via INTRAVENOUS

## 2022-09-20 MED ORDER — HYDROMORPHONE HCL 1 MG/ML IJ SOLN
0.5000 mg | Freq: Once | INTRAMUSCULAR | Status: AC
  Administered 2022-09-20: 0.5 mg via INTRAVENOUS
  Filled 2022-09-20: qty 0.5

## 2022-09-20 MED ORDER — IOHEXOL 300 MG/ML  SOLN
100.0000 mL | Freq: Once | INTRAMUSCULAR | Status: AC | PRN
  Administered 2022-09-20: 100 mL via INTRAVENOUS

## 2022-09-20 MED ORDER — ONDANSETRON HCL 4 MG/2ML IJ SOLN
4.0000 mg | Freq: Once | INTRAMUSCULAR | Status: AC
  Administered 2022-09-20: 4 mg via INTRAVENOUS
  Filled 2022-09-20: qty 2

## 2022-09-20 NOTE — ED Notes (Signed)
See triage notes. Patient complains of abdominal pain times three days. Started as diarrhea and now is gas and constipation

## 2022-09-20 NOTE — ED Provider Notes (Signed)
[facility] Provider Note    Event Date/Time   First MD Initiated Contact with Patient 09/20/22 1226     (approximate)   History   Chief Complaint Abdominal Pain   HPI  Jillian Vazquez is a 62 y.o. female with past medical history of GERD, stroke, bowel obstruction, and DVT/PE on Coumadin who presents to the ED complaining of abdominal pain.  Patient reports that she has had increasing pain over her abdomen over the past 2 days.  She states she initially had diarrhea yesterday but has not had a bowel movement since then and is beginning to feel constipated.  She states that she continues to pass gas normally, has felt nauseous but has not vomited.  She does report history of large ventral hernia, where she states is the primary area of pain.  She does feel like her abdomen has been increasingly distended.  She denies any fevers, dysuria, hematuria, or flank pain.     Physical Exam   Triage Vital Signs: ED Triage Vitals  Enc Vitals Group     BP 09/20/22 1000 115/84     Pulse Rate 09/20/22 0959 (!) 103     Resp 09/20/22 0959 18     Temp 09/20/22 0959 98.7 F (37.1 C)     Temp Source 09/20/22 0959 Oral     SpO2 09/20/22 1000 95 %     Weight 09/20/22 0959 259 lb 14.8 oz (117.9 kg)     Height 09/20/22 0959 5\' 5"  (1.651 m)     Head Circumference --      Peak Flow --      Pain Score 09/20/22 0959 7     Pain Loc --      Pain Edu? --      Excl. in [place]? --     Most recent vital signs: Vitals:   09/20/22 0959 09/20/22 1000  BP:  115/84  Pulse: (!) 103   Resp: 18   Temp: 98.7 F (37.1 C)   SpO2:  95%    Constitutional: Alert and oriented. Eyes: Conjunctivae are normal. Head: Atraumatic. Nose: No congestion/rhinnorhea. Mouth/Throat: Mucous membranes are moist.  Cardiovascular: Normal rate, regular rhythm. Grossly normal heart sounds.  2+ radial pulses bilaterally. Respiratory: Normal respiratory effort.  No retractions. Lungs  CTAB. Gastrointestinal: Soft and diffusely tender to palpation, particularly over left-sided ventral hernia. No distention. Musculoskeletal: No lower extremity tenderness nor edema.  Neurologic:  Normal speech and language. No gross focal neurologic deficits are appreciated.    ED Results / Procedures / Treatments   Labs (all labs ordered are listed, but only abnormal results are displayed) Labs Reviewed  CBC - Abnormal; Notable for the following components:      Result Value   WBC 13.6 (*)    Platelets 448 (*)    All other components within normal limits  URINALYSIS, ROUTINE W REFLEX MICROSCOPIC - Abnormal; Notable for the following components:   Hgb urine dipstick SMALL (*)    Leukocytes,Ua MODERATE (*)    All other components within normal limits  URINALYSIS, MICROSCOPIC (REFLEX) - Abnormal; Notable for the following components:   Bacteria, UA RARE (*)    All other components within normal limits  PROTIME-INR - Abnormal; Notable for the following components:   Prothrombin Time 31.6 (*)    INR 3.0 (*)    All other components within normal limits  COMPREHENSIVE METABOLIC PANEL  LIPASE, BLOOD   RADIOLOGY CT abdomen/pelvis reviewed and interpreted by  me with large ventral hernia with no evidence of obstruction.  PROCEDURES:  Critical Care performed: No  Procedures   MEDICATIONS ORDERED IN ED: Medications  HYDROmorphone (DILAUDID) injection 0.5 mg (0.5 mg Intravenous Given 09/20/22 1354)  ondansetron (ZOFRAN) injection 4 mg (4 mg Intravenous Given 09/20/22 1354)  lactated ringers bolus 1,000 mL (1,000 mLs Intravenous New Bag/Given 09/20/22 1353)  iohexol (OMNIPAQUE) 300 MG/ML solution 100 mL (100 mLs Intravenous Contrast Given 09/20/22 1400)     IMPRESSION / MDM / ASSESSMENT AND PLAN / ED COURSE  I reviewed the triage vital signs and the nursing notes.                              62 y.o. female with past medical history of GERD, stroke, bowel obstruction, and DVT/PE on  Coumadin who presents to the ED complaining of increasing abdominal pain with constipation and nausea over the past 2 days.  Patient's presentation is most consistent with acute presentation with potential threat to life or bodily function.  Differential diagnosis includes, but is not limited to, bowel obstruction, incarcerated hernia, strangulated hernia, diverticulitis, appendicitis, dehydration, electrode abnormality, AKI, kidney stone, UTI.  Patient nontoxic-appearing and in no acute distress, vital signs remarkable for tachycardia but otherwise reassuring.  Her abdomen is soft but she has a ventral hernia where much of her tenderness is located.  We will further assess with CT imaging, treat symptomatically with IV Dilaudid and Zofran, hydrate with IV fluids.  Labs overall reassuring and remarkable for mild leukocytosis but no significant anemia, electrolyte abnormality, or AKI.  LFTs and lipase are unremarkable, urinalysis shows no signs of infection.  We will add on INR and reassess following meds and imaging.  INR is therapeutic at 3.0.  CT imaging shows large ventral hernia with no apparent complication, no other acute process identified.  On reassessment, patient denies any ongoing pain and hernia is reducible.  Patient is appropriate for discharge home with general surgery follow-up, was counseled to return to the ED for new or worsening symptoms.  Patient agrees with plan.      FINAL CLINICAL IMPRESSION(S) / ED DIAGNOSES   Final diagnoses:  Generalized abdominal pain  Ventral hernia without obstruction or gangrene     Rx / DC Orders   ED Discharge Orders     None        Note:  This document was prepared using Dragon voice recognition software and may include unintentional dictation errors.   Chesley Noon, MD 09/20/22 1515

## 2022-09-20 NOTE — ED Triage Notes (Signed)
Pt here with abd pain x3 days. Pt states it started as diarrhea but now is constipation and gas. Pt has a hernia as well. Pt states pain is lower.

## 2024-02-09 ENCOUNTER — Ambulatory Visit
Admission: EM | Admit: 2024-02-09 | Discharge: 2024-02-09 | Disposition: A | Discharge status: Home / Self Care | Attending: Emergency Medicine | Admitting: Emergency Medicine

## 2024-02-09 ENCOUNTER — Encounter: Payer: Self-pay | Admitting: Emergency Medicine

## 2024-02-09 DIAGNOSIS — R42 Dizziness and giddiness: Secondary | ICD-10-CM

## 2024-02-09 DIAGNOSIS — R002 Palpitations: Secondary | ICD-10-CM

## 2024-02-09 DIAGNOSIS — R0602 Shortness of breath: Secondary | ICD-10-CM | POA: Diagnosis not present

## 2024-02-09 DIAGNOSIS — I161 Hypertensive emergency: Secondary | ICD-10-CM | POA: Diagnosis not present

## 2024-02-09 NOTE — ED Provider Notes (Signed)
 [facility]    CSN: [id number] Arrival date & time: 02/09/24  1908      History   Chief Complaint Chief Complaint  Patient presents with   Dizziness   Shortness of Breath    HPI Jillian Vazquez is a 63 y.o. female.   HPI  63 year old female with past medical history significant for anxiety, depression, CVA, DVT, GERD, and SBO presents for evaluation of 2 weeks worth of dizziness, heart palpitations, headaches in the morning, and fatigue.  She also reports that she has shortness of breath though reports that that is more baseline.  She denies any chest pain, change in vision, sweating, nausea, numbness, tingling, weakness.  Past Medical History:  Diagnosis Date   Anxiety and depression    CVA (cerebral infarction)    DVT (deep venous thrombosis) (HCC)    W/ PE   GERD (gastroesophageal reflux disease)    Small bowel obstruction (HCC)    hx   Stroke [facility])     Patient Active Problem List   Diagnosis Date Noted   Elevated troponin 07/18/2021    Past Surgical History:  Procedure Laterality Date   CARDIAC CATHETERIZATION  12/14   [facility]    OB History   No obstetric history on file.      Home Medications    Prior to Admission medications   Medication Sig Start Date End Date Taking? Authorizing Provider  acetaminophen -codeine  (TYLENOL  #3) 300-30 MG tablet Take 1 tablet by mouth every 6 (six) hours as needed for moderate pain. 08/07/20   Saunders Shona CROME, PA-C  ALPRAZolam  (XANAX ) 0.25 MG tablet Take 1 tablet (0.25 mg total) by mouth 2 (two) times daily as needed for anxiety. 07/19/21   Jhonny Calvin NOVAK, MD  aspirin  81 MG tablet Take 81 mg by mouth daily.    [provider]  cyclobenzaprine  (FLEXERIL ) 5 MG tablet Take 1 tablet (5 mg total) by mouth 3 (three) times daily as needed. 03/30/22   Menshew, Candida LULLA Kings, PA-C  iron polysaccharides (NIFEREX) 150 MG capsule Take 150 mg by mouth 2 (two) times daily.    [provider]  lidocaine   (XYLOCAINE ) 2 % solution Apply to cotton ball and apply to dental pain q 4 hours 10/08/14   Saunders Shona CROME, PA-C  lisinopril -hydrochlorothiazide (ZESTORETIC) 10-12.5 MG tablet Take 1 tablet by mouth daily.    [provider]  metoprolol tartrate (LOPRESSOR) 25 MG tablet Take 25 mg by mouth 2 (two) times daily.    [provider]  omeprazole (PRILOSEC) 20 MG capsule Take 20 mg by mouth daily.    [provider]  vitamin B-12 (CYANOCOBALAMIN) 1000 MCG tablet Take 1,000 mcg by mouth daily. Patient not taking: Reported on 07/18/2021    [provider]  warfarin (COUMADIN ) 5 MG tablet Take 5 mg by mouth daily.    [provider]    Family History History reviewed. No pertinent family history.  Social History Social History   Tobacco Use   Smoking status: Never   Smokeless tobacco: Never  Vaping Use   Vaping status: Never Used  Substance Use Topics   Alcohol use: No   Drug use: No     Allergies   Percocet [oxycodone-acetaminophen ] and Vicodin [hydrocodone-acetaminophen ]   Review of Systems Review of Systems  Constitutional:  Positive for fatigue. Negative for diaphoresis.  Eyes:  Negative for visual disturbance.  Respiratory:  Positive for shortness of breath.   Cardiovascular:  Positive for palpitations. Negative  for chest pain.  Neurological:  Positive for dizziness and headaches.     Physical Exam Triage Vital Signs ED Triage Vitals  Encounter Vitals Group     BP 02/09/24 1921 (!) 166/109     Girls Systolic BP Percentile --      Girls Diastolic BP Percentile --      Boys Systolic BP Percentile --      Boys Diastolic BP Percentile --      Pulse Rate 02/09/24 1921 91     Resp 02/09/24 1921 20     Temp 02/09/24 1921 98.4 F (36.9 C)     Temp Source 02/09/24 1921 Oral     SpO2 02/09/24 1921 94 %     Weight 02/09/24 1920 259 lb 14.8 oz (117.9 kg)     Height 02/09/24 1920 5' 5 (1.651 m)     Head Circumference --      Peak  Flow --      Pain Score 02/09/24 1920 0     Pain Loc --      Pain Education --      Exclude from Growth Chart --    No data found.  Updated Vital Signs BP (!) 166/109 (BP Location: Left Arm)   Pulse 91   Temp 98.4 F (36.9 C) (Oral)   Resp 20   Ht 5' 5 (1.651 m)   Wt 259 lb 14.8 oz (117.9 kg)   SpO2 94%   BMI 43.25 kg/m   Visual Acuity Right Eye Distance:   Left Eye Distance:   Bilateral Distance:    Right Eye Near:   Left Eye Near:    Bilateral Near:     Physical Exam Vitals and nursing note reviewed.  Constitutional:      Appearance: Normal appearance. She is not ill-appearing.  HENT:     Head: Normocephalic and atraumatic.  Eyes:     Extraocular Movements: Extraocular movements intact.     Conjunctiva/sclera: Conjunctivae normal.     Pupils: Pupils are equal, round, and reactive to light.  Cardiovascular:     Rate and Rhythm: Normal rate and regular rhythm.     Pulses: Normal pulses.     Heart sounds: Normal heart sounds. No murmur heard.    No friction rub. No gallop.  Pulmonary:     Effort: Pulmonary effort is normal.     Breath sounds: Normal breath sounds. No wheezing, rhonchi or rales.  Skin:    General: Skin is warm and dry.     Capillary Refill: Capillary refill takes less than 2 seconds.     Findings: No rash.  Neurological:     General: No focal deficit present.     Mental Status: She is alert and oriented to person, place, and time.     Cranial Nerves: No cranial nerve deficit.      UC Treatments / Results  Labs (all labs ordered are listed, but only abnormal results are displayed) Labs Reviewed - No data to display  EKG Sinus rhythm with PACs ventricular rate 94 bpm PR interval 144 ms QRS duration 80 ms QT/QTc 348/435 ms No ST or T wave abnormalities noted.  Radiology No results found.  Procedures Procedures (including critical care time)  Medications Ordered in UC Medications - No data to display  Initial Impression /  Assessment and Plan / UC Course  I have reviewed the triage vital signs and the nursing notes.  Pertinent labs & imaging results that were available during  my care of the patient were reviewed by me and considered in my medical decision making (see chart for details).   Patient is a nontoxic-appearing 63 year old female presenting for evaluation of cardiac and neurologic symptoms that have been going on for the last 2 weeks.  She contacted the advice nurse at [facility] neurology who advised her to go to the emergency department so she came to urgent care.  She is reporting that she has been experiencing headaches in the morning, dizziness off-and-on, palpitations, and fatigue.  She also has shortness of breath but reports that that is baseline.  In the exam room she is alert and oriented x 3 and she is moving all extremities independently.  Cranial nerves II through XII are grossly intact.  Pupils are equal round reactive and EOM's intact.  Cardiopulmonary exam reveals S1-S2 heart sounds with regular rate and rhythm and lung sounds are all fields.  Patient's blood pressure is high at 166/109.  She reports that she is not currently taking her blood pressure medication and has not had any medication in the last 5 months.  She last saw her PCP, per her report, 2 to 3 months ago but did not let her PCP know that she did not have a blood pressure medication.  Because of the patient's past medical history I do feel that she needs to be evaluated in the emergency department.  She needs cardiac enzymes to ensure that she has not suffered any cardiac damage which could be contributing to her symptoms as well as blood work to check her renal function and medicine to bring her blood pressure down.  She has elected to go to [facility].   Final Clinical Impressions(s) / UC Diagnoses   Final diagnoses:  Dizziness  Shortness of breath  Palpitations  Hypertensive emergency     Discharge Instructions       Please go to the emergency department to be evaluated for your elevated blood pressure and intermittent dizziness.     ED Prescriptions   None    PDMP not reviewed this encounter.   Bernardino Ditch, NP 02/09/24 1939

## 2024-02-09 NOTE — ED Triage Notes (Signed)
 Patient reports chronic SOB for years.  Patient reports dizziness for 2 weeks off and on.  Patient denies any cold symptoms.

## 2024-02-09 NOTE — ED Notes (Signed)
 Patient is being discharged from the Urgent Care and sent to the [facility] Emergency Department via private vehicle with family . Per Venetia Motto, NP, patient is in need of higher level of care due to Dizziness and SOB. Patient is aware and verbalizes understanding of plan of care.  Vitals:   02/09/24 1921  BP: (!) 166/109  Pulse: 91  Resp: 20  Temp: 98.4 F (36.9 C)  SpO2: 94%

## 2024-02-09 NOTE — Discharge Instructions (Addendum)
 Please go to the emergency department to be evaluated for your elevated blood pressure and intermittent dizziness.

## 2024-03-28 IMAGING — DX DG KNEE COMPLETE 4+V*R*
4 series · 4 of 4 positions shown · non-contrast
Comparison: None

CLINICAL DATA: Anterior distal femur swelling and pain

EXAM:
RIGHT KNEE - COMPLETE 4+ VIEW

[knee ap]
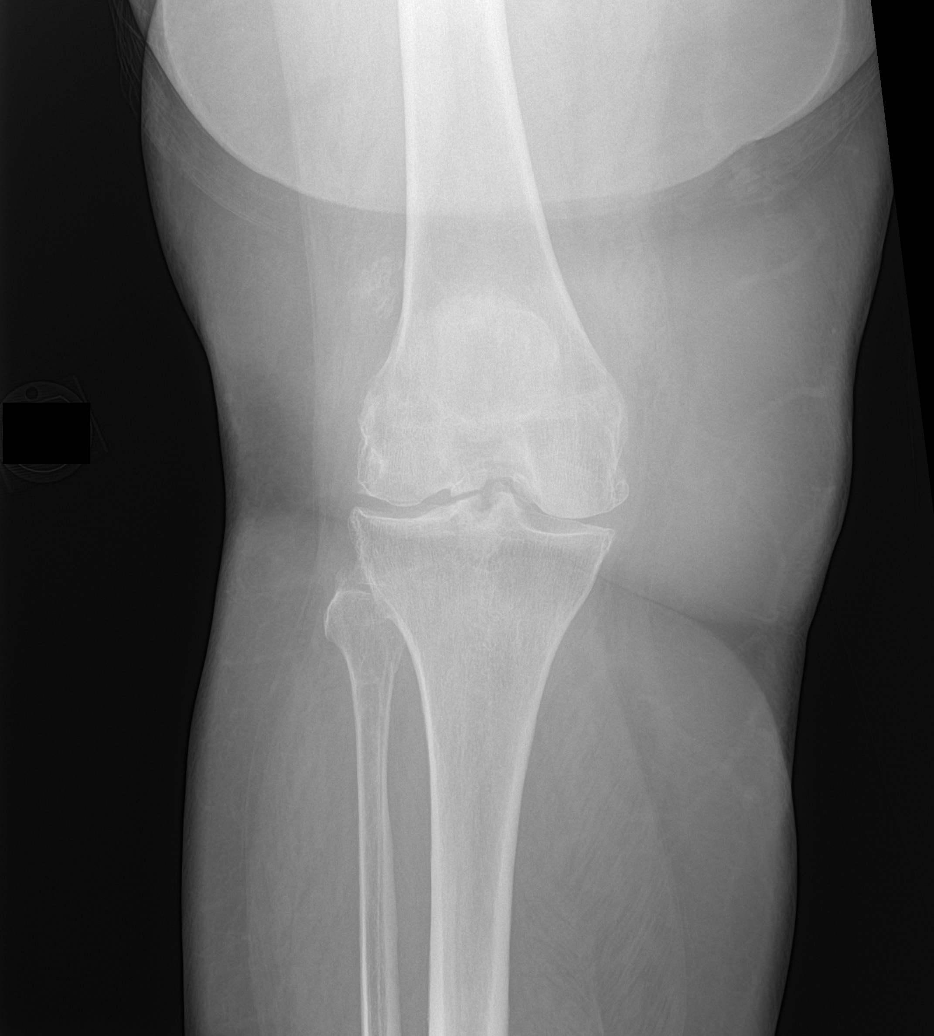

[knee lat]
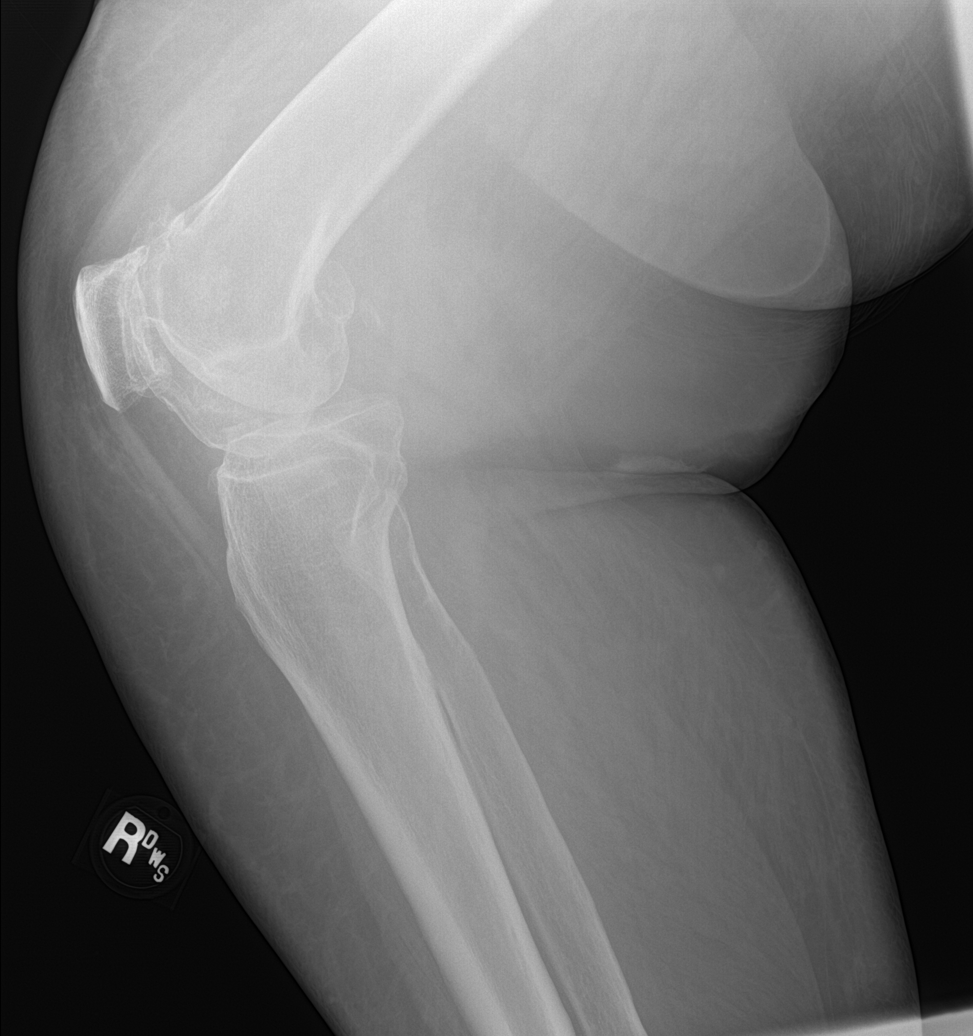

[knee obl (1 of 2)]
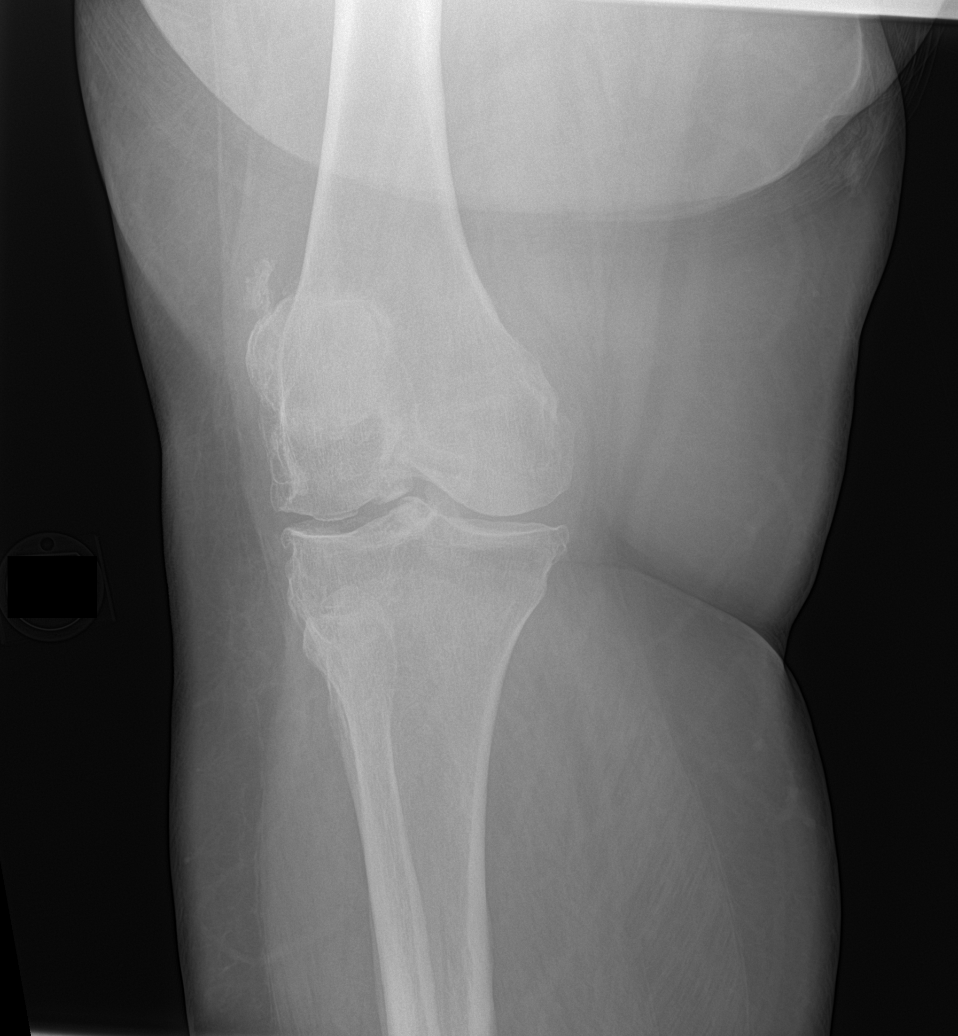

[knee obl (2 of 2)]
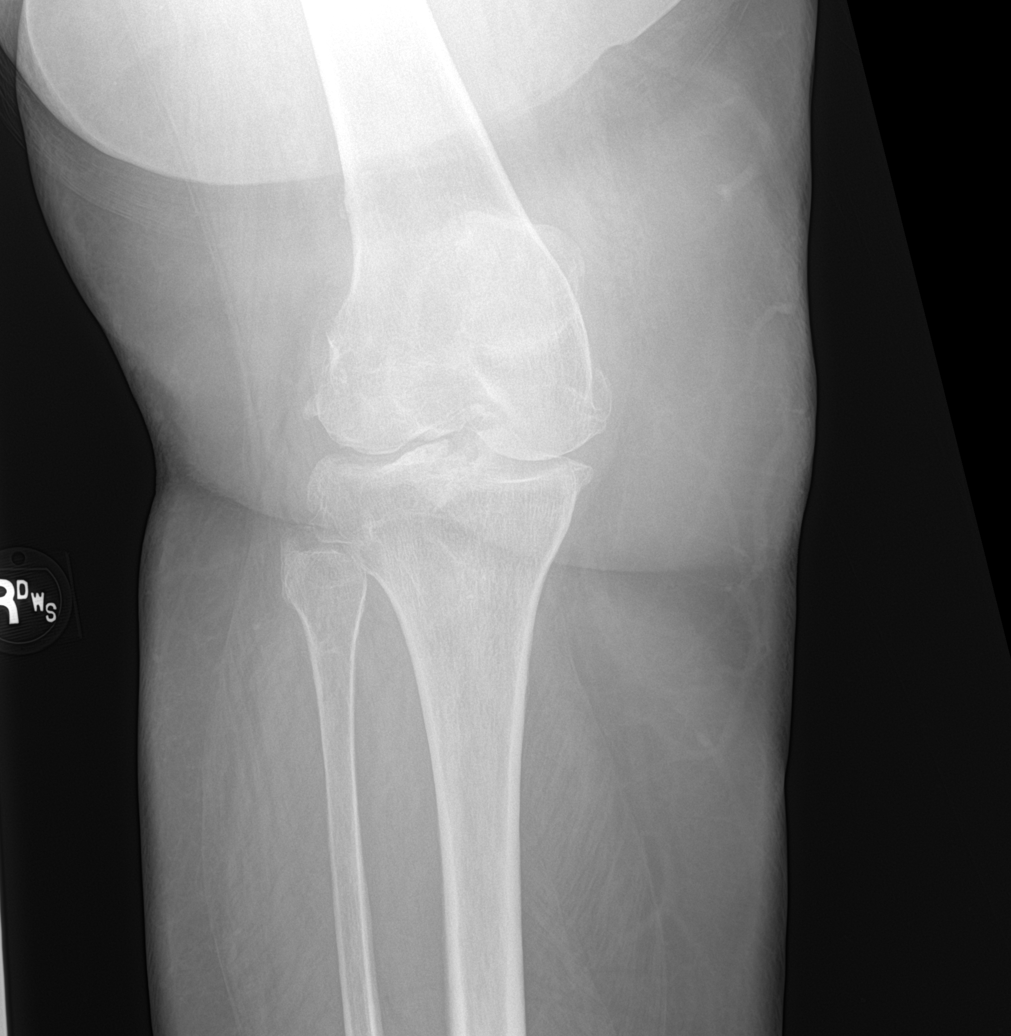

[4 of 4 positions shown; findings below may reference images not displayed]

FINDINGS: Moderate to severe tricompartment osteoarthritis is identified. No
joint effusion. No signs of acute fracture or dislocation. Soft
tissues are unremarkable.
IMPRESSION: Moderate to severe tricompartment osteoarthritis.

## 2024-05-02 IMAGING — CT CT HEAD W/O CM
4 series · 17 of 47 positions shown, 19 images · non-contrast
Comparison: 07/22/2006

CLINICAL DATA: Possible acute stroke. Dizziness with tingling in
fingers and fatigue.



[Series 2: head wo · axial · 0.44mm/px · z∈[-103,+17]mm · 7 of 34 slices shown, 9 images]
[im 5/34  brain]
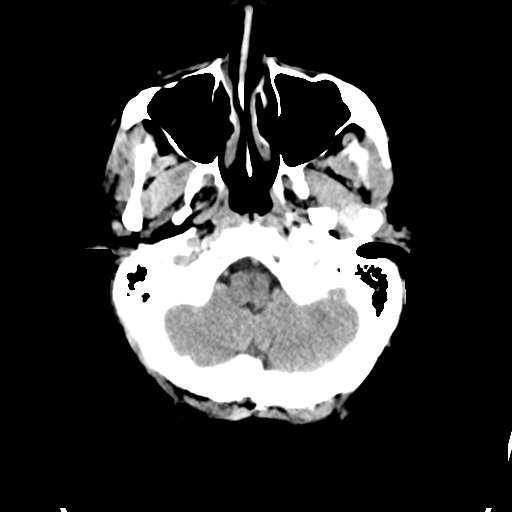
[im 5/34  bone]
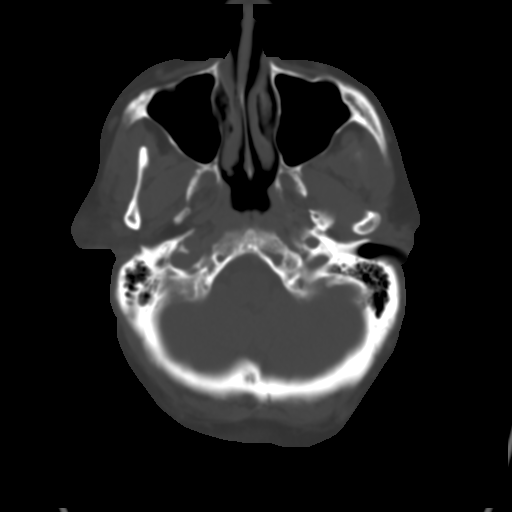
[im 9/34  brain]
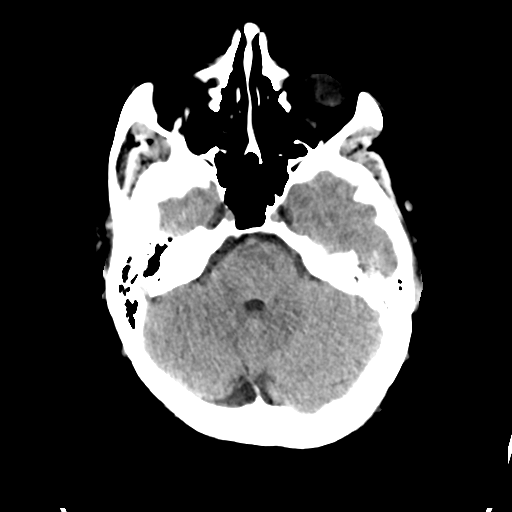
[im 13/34  brain]
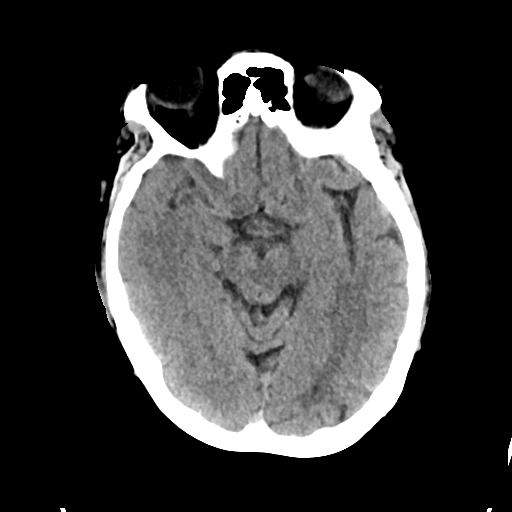
[im 17/34  brain]
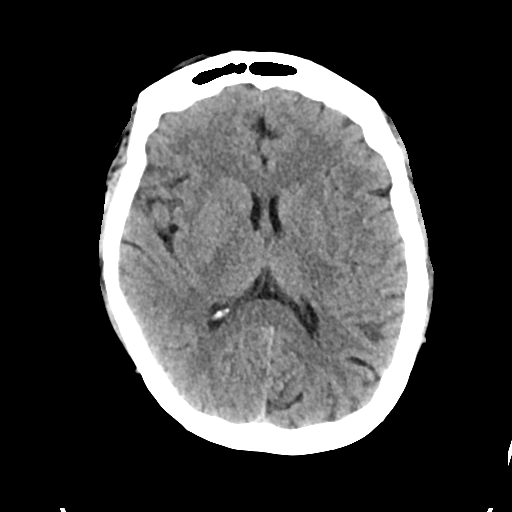
[im 21/34  brain]
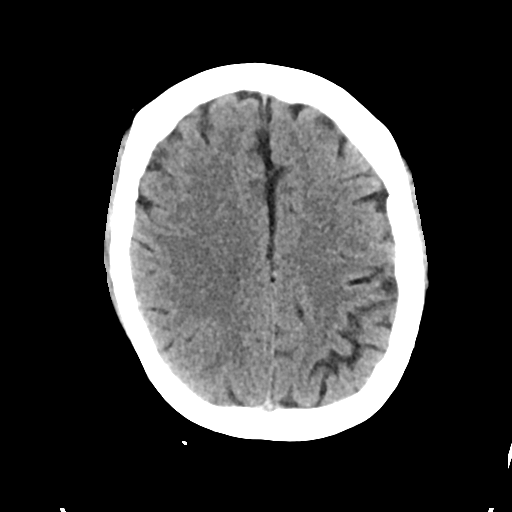
[im 21/34  bone]
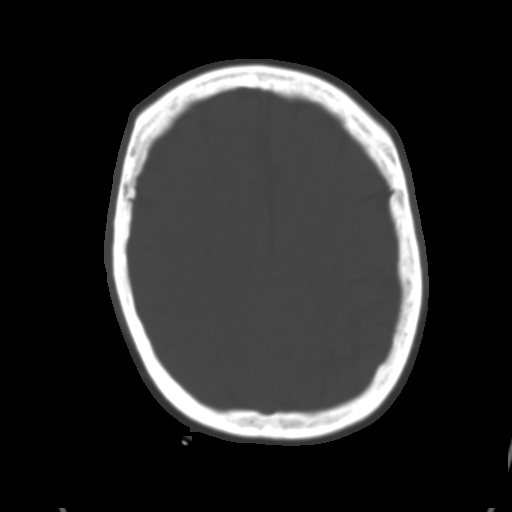
[im 25/34  brain]
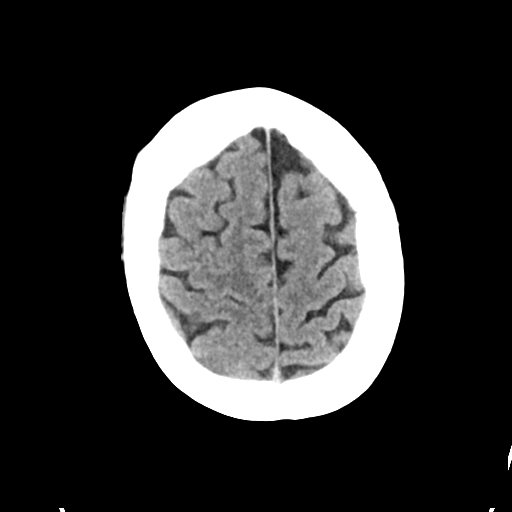
[im 29/34  brain]
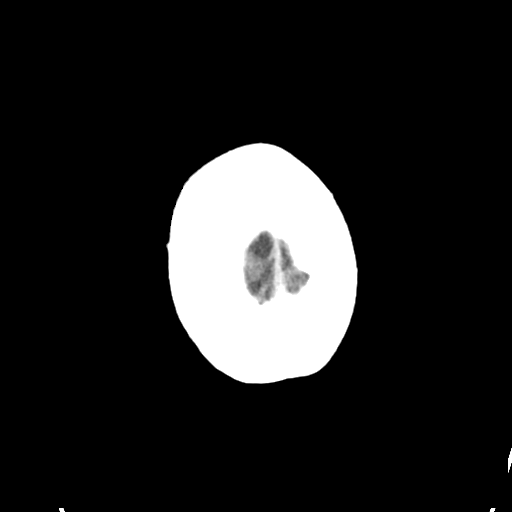

[Series 3: head bone · axial · 0.44mm/px · z∈[-107,-49]mm · 4 of 84 slices shown]
[im 9/84  bone]
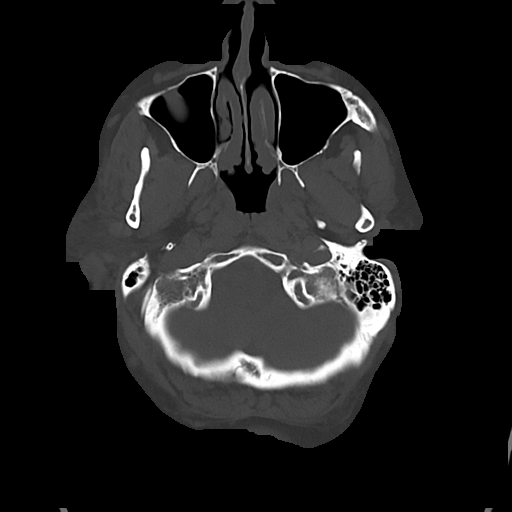
[im 17/84  bone]
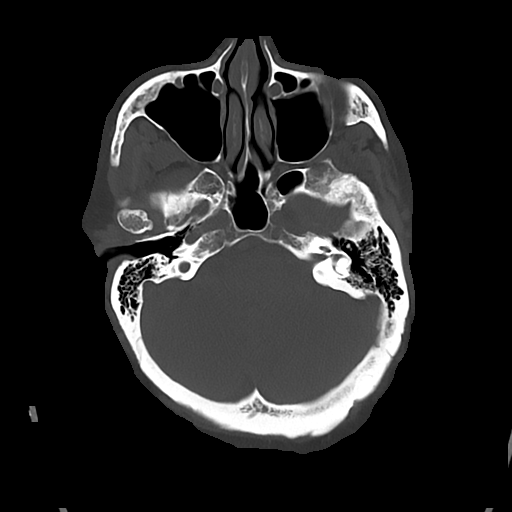
[im 25/84  bone]
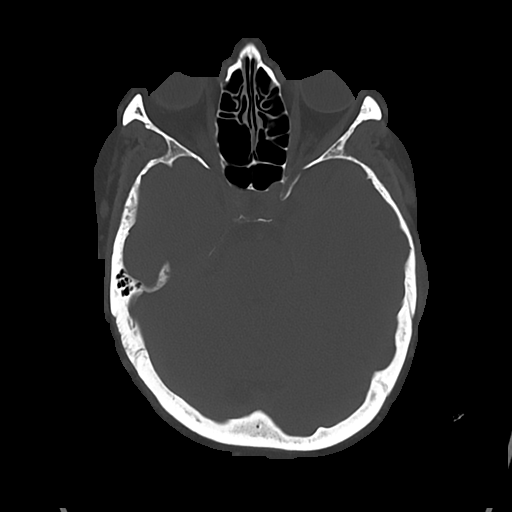
[im 38/84  bone]
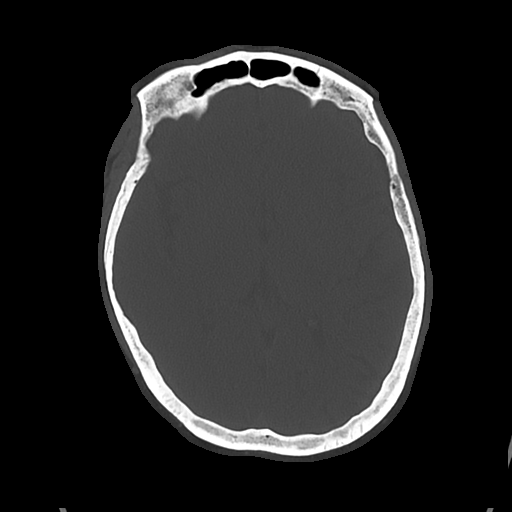

[Series 4: cor soft · coronal · 0.36mm/px · 3 of 73 slices shown]
[im 25/73  brain]
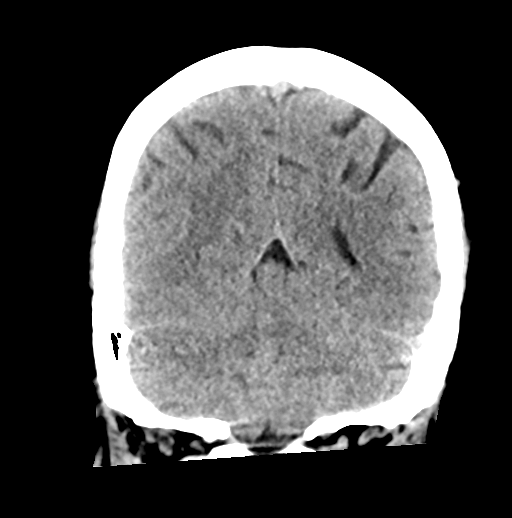
[im 33/73  brain]
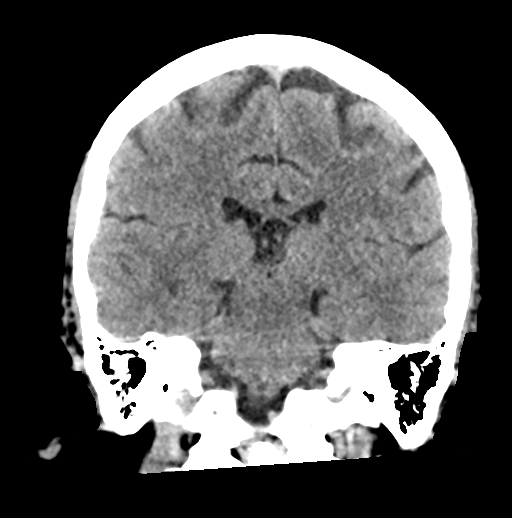
[im 41/73  brain]
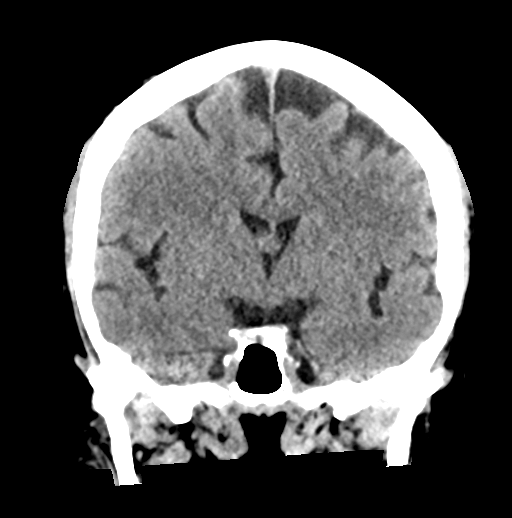

[Series 5: sag soft · sagittal · 0.37mm/px · 3 of 61 slices shown]
[im 21/61  brain]
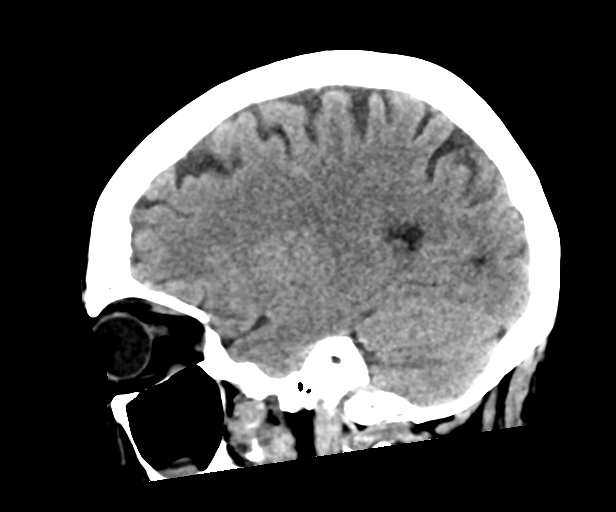
[im 31/61  brain]
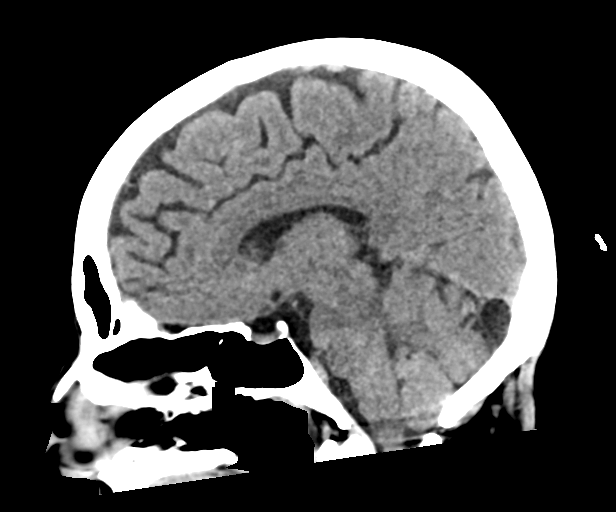
[im 41/61  brain]
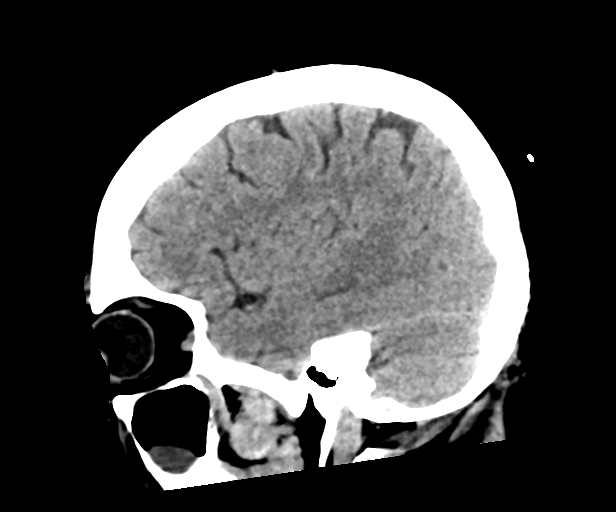

[17 of 47 positions shown; findings below may reference images not displayed]

FINDINGS: Brain: Ventricles, cisterns and other CSF spaces are normal. There
is no mass, mass effect, shift of midline structures or acute
hemorrhage. No evidence of acute infarction.

Vascular: No hyperdense vessel or unexpected calcification.

Skull: Normal. Negative for fracture or focal lesion.

Sinuses/Orbits: Orbits are normal. Opacification over the maxillary
sinuses compatible chronic inflammatory change.

Other: None.
IMPRESSION: 1. No acute findings.
2. Chronic inflammatory change of the maxillary sinuses.
